# Patient Record
Sex: Female | Born: 1995 | Race: White | Hispanic: No | Marital: Single | Smoking: Current every day smoker
Health system: Southern US, Community
[De-identification: ages and names within clinical notes are randomized; demographics above are authoritative.]

## PROBLEM LIST (undated history)

## (undated) DIAGNOSIS — F329 Major depressive disorder, single episode, unspecified: Secondary | ICD-10-CM

## (undated) DIAGNOSIS — F319 Bipolar disorder, unspecified: Secondary | ICD-10-CM

## (undated) DIAGNOSIS — F419 Anxiety disorder, unspecified: Secondary | ICD-10-CM

## (undated) DIAGNOSIS — T7840XA Allergy, unspecified, initial encounter: Secondary | ICD-10-CM

## (undated) DIAGNOSIS — F32A Depression, unspecified: Secondary | ICD-10-CM

## (undated) HISTORY — PX: TYMPANOSTOMY TUBE PLACEMENT: SHX32

## (undated) HISTORY — DX: Allergy, unspecified, initial encounter: T78.40XA

## (undated) HISTORY — DX: Major depressive disorder, single episode, unspecified: F32.9

## (undated) HISTORY — DX: Anxiety disorder, unspecified: F41.9

## (undated) HISTORY — DX: Depression, unspecified: F32.A

---
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
## 2016-05-09 DIAGNOSIS — Q130 Coloboma of iris: Secondary | ICD-10-CM | POA: Insufficient documentation

## 2016-06-28 DIAGNOSIS — F4310 Post-traumatic stress disorder, unspecified: Secondary | ICD-10-CM | POA: Insufficient documentation

## 2016-06-28 DIAGNOSIS — F411 Generalized anxiety disorder: Secondary | ICD-10-CM | POA: Insufficient documentation

## 2016-08-22 DIAGNOSIS — G40209 Localization-related (focal) (partial) symptomatic epilepsy and epileptic syndromes with complex partial seizures, not intractable, without status epilepticus: Secondary | ICD-10-CM | POA: Insufficient documentation

## 2016-11-17 DIAGNOSIS — R109 Unspecified abdominal pain: Secondary | ICD-10-CM | POA: Insufficient documentation

## 2016-11-29 DIAGNOSIS — K5900 Constipation, unspecified: Secondary | ICD-10-CM | POA: Insufficient documentation

## 2017-01-21 ENCOUNTER — Ambulatory Visit (INDEPENDENT_AMBULATORY_CARE_PROVIDER_SITE_OTHER): Payer: Managed Care, Other (non HMO) | Admitting: Family Medicine

## 2017-01-21 ENCOUNTER — Encounter: Payer: Self-pay | Admitting: Family Medicine

## 2017-01-21 VITALS — BP 114/70 | HR 89 | Temp 98.1°F | Resp 17 | Ht 69.0 in | Wt 134.0 lb

## 2017-01-21 DIAGNOSIS — K5909 Other constipation: Secondary | ICD-10-CM | POA: Diagnosis not present

## 2017-01-21 DIAGNOSIS — E538 Deficiency of other specified B group vitamins: Secondary | ICD-10-CM | POA: Diagnosis not present

## 2017-01-21 DIAGNOSIS — G40909 Epilepsy, unspecified, not intractable, without status epilepticus: Secondary | ICD-10-CM

## 2017-01-21 DIAGNOSIS — N390 Urinary tract infection, site not specified: Secondary | ICD-10-CM

## 2017-01-21 DIAGNOSIS — N3000 Acute cystitis without hematuria: Secondary | ICD-10-CM

## 2017-01-21 DIAGNOSIS — R202 Paresthesia of skin: Secondary | ICD-10-CM | POA: Diagnosis not present

## 2017-01-21 DIAGNOSIS — T887XXA Unspecified adverse effect of drug or medicament, initial encounter: Secondary | ICD-10-CM | POA: Diagnosis not present

## 2017-01-21 DIAGNOSIS — M79601 Pain in right arm: Secondary | ICD-10-CM

## 2017-01-21 DIAGNOSIS — M79602 Pain in left arm: Secondary | ICD-10-CM

## 2017-01-21 DIAGNOSIS — R634 Abnormal weight loss: Secondary | ICD-10-CM

## 2017-01-21 DIAGNOSIS — F319 Bipolar disorder, unspecified: Secondary | ICD-10-CM | POA: Diagnosis not present

## 2017-01-21 LAB — POCT URINALYSIS DIP (MANUAL ENTRY)
GLUCOSE UA: NEGATIVE mg/dL
Ketones, POC UA: NEGATIVE mg/dL
NITRITE UA: POSITIVE — AB
PH UA: 6 (ref 5.0–8.0)
Protein Ur, POC: 30 mg/dL — AB
Spec Grav, UA: >=1.03 — AB (ref 1.010–1.025)
UROBILINOGEN UA: 0.2 U/dL

## 2017-01-21 LAB — POC MICROSCOPIC URINALYSIS (UMFC): MUCUS RE: ABSENT

## 2017-01-21 MED ORDER — CEPHALEXIN 500 MG PO CAPS: 500.0000 mg | ORAL_CAPSULE | Freq: Four times a day (QID) | ORAL | 0 refills | Status: DC

## 2017-01-21 NOTE — Progress Notes (Addendum)
Subjective:  Patient was seen in Room 11 .   Patient ID: Joanna Wood, female    DOB: Mar 17, 1996, 21 y.o.   MRN: [id number] Chief Complaint  Patient presents with  . Establish Care    recently moved back to town & needs to est care with a PCP  . Flu Vaccine    wants flu shot today  . Depression    per triage screening   HPI Joanna Wood is a 21 y.o. female who presents to Primary Care at [facility] to establish care. Moved from [place] last mo to be with her paternal grandmother and looking for work.  Is on generic geodon. Seeing psych at [facility] and has been restarted on lamictal and vistaril 10d ago as well. Now she is back on all her prior meds which she went off for several mos during the move except for xanax has not been restarted - feels like she would benefit from it by her anxiety.  She had stopped taking her medications intentioanlly as she had lost 30 lbs unintentionally which continued off of the meds since May (4 mos). She would get sweaty, nauseated, hand paresthesias after eating and would get cold, dizzy, paresthesias if she didn't eat.  Still with nausea.  She has sought care for this and went to the hosp where she had blood work done but was told severe UTI but nothing else wrong. She was having diffuse myalgias to the point where she couldn't get out of bed.   Was having black-out episodes - it feels like she blinks - but it will be 2 hrs later and she will be doing something else. 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  She does not have a memory of saying things.  Recurrent UTIs as she doesn't get any dysuria but just noticed decreased urine frequency and an odor.   When she wakes up her arms are sore and numb.  Chronic constipation from psych meds.    Depression screen PHQ 2/9 01/21/2017  Decreased Interest 3  Down, Depressed,  Hopeless 3  PHQ - 2 Score 6  Altered sleeping 3  Tired, decreased energy 3  Change in appetite 3  Feeling bad or failure about yourself  3  Trouble concentrating 2  Moving slowly or fidgety/restless 1  Suicidal thoughts 3  PHQ-9 Score 24  Difficult doing work/chores Very difficult      Past Medical History:  Diagnosis Date  . Allergy   . Anxiety   . Bipolar 1 disorder (HCC)   . Depression    Prior to Admission medications   Medication Sig Start Date End Date Taking? Authorizing Provider  hydrOXYzine (VISTARIL) 50 MG capsule Take 50 mg by mouth at bedtime.   Yes [provider]  lamoTRIgine (LAMICTAL) 100 MG tablet Take 150 mg by mouth every morning.   Yes [provider]  ziprasidone (GEODON) 20 MG capsule Take 20 mg by mouth 2 (two) times daily with a meal.   Yes [provider]   No Known Allergies   No past surgical history on file. Family History  Problem Relation Age of Onset  . Hypertension Mother   . Mental illness Mother   . Mental illness Sister    Social History   Social History  . Marital status: Single    Spouse name: N/A  . Number of children:  N/A  . Years of education: N/A   Social History Main Topics  . Smoking status: Current Every Day Smoker    Packs/day: 1.00    Years: 1.00    Types: Cigarettes  . Smokeless tobacco: Never Used  . Alcohol use No  . Drug use: No  . Sexual activity: Not Asked   Other Topics Concern  . None   Social History Narrative  . None     Review of Systems  Constitutional: Positive for fatigue and unexpected weight change (30 lb weight loss). Negative for activity change, appetite change, chills, diaphoresis and fever.  Gastrointestinal: Positive for constipation.  Neurological: Positive for tremors, seizures and numbness.  Psychiatric/Behavioral: Positive for confusion, decreased concentration and dysphoric mood. The patient is nervous/anxious.    See hpi    Objective:   Physical  Exam  Constitutional: She is oriented to person, place, and time. She appears well-developed and well-nourished. No distress.  HENT:  Head: Normocephalic and atraumatic.  Right Ear: External ear normal.  Left Ear: External ear normal.  Eyes: Pupils are equal, round, and reactive to light. Conjunctivae and EOM are normal. No scleral icterus.  Neck: Normal range of motion. Neck supple. No thyromegaly present.  Cardiovascular: Normal rate, regular rhythm, normal heart sounds and intact distal pulses.   Pulmonary/Chest: Effort normal and breath sounds normal. No respiratory distress.  Musculoskeletal: Normal range of motion. She exhibits no edema.  Lymphadenopathy:    She has no cervical adenopathy.  Neurological: She is alert and oriented to person, place, and time.  Skin: Skin is warm and dry. She is not diaphoretic. No erythema.  Psychiatric: She has a normal mood and affect. Her behavior is normal.  Nursing note and vitals reviewed.   BP 114/70   Pulse 89   Temp 98.1 F (36.7 C) (Oral)   Resp 17   Ht 5\' 9"  (1.753 m)   Wt 134 lb (60.8 kg)   LMP 01/16/2017 (Approximate)   SpO2 99%   BMI 19.79 kg/m     Labs reviewed from Care Everywhere: EEG 08/25/16 Interpretation and summary:    This awake and sleep EEG is normal within the broad range of variability for age.No paroxysmal epileptiform discharges or focal slowing is seen.A normal EEG does not exclude the diagnosis of a seizure disorder.Clinical correlation is advised.   On 11/13/16 and 11/17/16 you had negative blood tests for Celiac disease (gluten intolerance), H. Pylori (bacteria that causes stomach ulcers), complete metabolic panel (kidneys, liver, electrolytes), thyroid panel, negative blood pregnancy, normal blood counts. You are immune to hepatitis A but not to hepatitis B. You do not have hepatitis C.   Assessment & Plan:   1. Recent unintentional weight loss over several months   2. 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 Was on lamictal but level very subtherapeutic at the time so was rec to increase dose and f/u - instead pt when off of lamictal during move and just restarted 10d prior. Episodes still ongoing so will refer to neurology in [place] to cont further eval.  3. Bipolar 1 disorder (HCC) - following with psych at [facility]  4. Medication side effect   5. Recurrent UTI - pt does not know why she has had so many UTIs -  no prior pelvic exam so will need to RTC for repeat UA and pelvic exam - may need to consider renal US or CT urogram - consider urology eval. Start keflex.  6. Paresthesia and pain of both upper extremities - refer to neurology - check b12  7. Chronic constipation - pt thinks secondary to meds and caused chronic abdominal pain. No prior preventative treatment - start daily fiber supp  8. Vitamin B12 deficiency - check level  9. Acute cystitis without hematuria     Orders Placed This Encounter  Procedures  . Comprehensive metabolic panel  . CBC with Differential/Platelet  . Vitamin B12  . Thyroid Panel With TSH  . Sedimentation Rate  . C-reactive protein  . Hemoglobin A1c  . Ambulatory referral to Neurology    Referral Priority:   Routine    Referral Type:   Consultation    Referral Reason:   Specialty Services Required    Requested Specialty:   Neurology    Number of Visits Requested:   1  . POCT urinalysis dipstick  . POCT Microscopic Urinalysis ([facility])    Meds ordered this encounter  Medications  . ziprasidone (GEODON) 20 MG capsule    Sig: Take 20 mg by mouth 2 (two) times daily with a meal.  . cephALEXin (KEFLEX) 500 MG capsule    Sig: Take 1 capsule (500 mg total) by mouth 4 (four) times daily.    Dispense:  28 capsule     Refill:  0   Over 45 min spent in face-to-face evaluation of and consultation with patient and coordination of care.  Over 50% of this time was spent counseling this patient regarding history of seizures, chronic constipation, recurrent UTIs with review of all prior records and work-up done to date with findings, diagnosis, recommendations, then plan for moving forward in w/u.  Norberto SorensonEva Ramesses Crampton, M.D.  Primary [facility] at [facility]  [place] [address] [place], [place] [address] [phone] phone [phone] fax  01/22/17 2:43 PM  Results for orders placed or performed in visit on 01/21/17  Comprehensive metabolic panel  Result Value Ref Range   Glucose 89 65 - 99 mg/dL   BUN 15 6 - 20 mg/dL   Creatinine, Ser 1.300.81 0.57 - 1.00 mg/dL   GFR calc non Af Amer 105 >59 mL/min/1.73   GFR calc Af Amer 121 >59 mL/min/1.73   BUN/Creatinine Ratio 19 9 - 23   Sodium 141 134 - 144 mmol/L   Potassium 3.9 3.5 - 5.2 mmol/L   Chloride 103 96 - 106 mmol/L   CO2 22 20 - 29 mmol/L   Calcium 9.4 8.7 - 10.2 mg/dL   Total Protein 7.0 6.0 - 8.5 g/dL   Albumin 4.5 3.5 - 5.5 g/dL   Globulin, Total 2.5 1.5 - 4.5 g/dL   Albumin/Globulin Ratio 1.8 1.2 - 2.2   Bilirubin Total 0.4 0.0 - 1.2 mg/dL   Alkaline Phosphatase 41 39 - 117 IU/L   AST 13 0 - 40 IU/L   ALT 6 0 - 32 IU/L  CBC with Differential/Platelet  Result Value Ref Range   WBC 4.6 3.4 - 10.8 x10E3/uL   RBC 4.23 3.77 - 5.28 x10E6/uL   Hemoglobin 13.1 11.1 - 15.9 g/dL   Hematocrit 86.540.8 78.434.0 - 46.6 %   MCV 97 79 - 97 fL   MCH 31.0 26.6 - 33.0 pg   MCHC 32.1 31.5 - 35.7 g/dL   RDW 69.613.0 29.512.3 - 28.415.4 %   Platelets  244 150 - 379 x10E3/uL   Neutrophils 54 Not Estab. %   Lymphs 34 Not Estab. %   Monocytes 8 Not Estab. %   Eos 3 Not Estab. %   Basos 1 Not Estab. %   Neutrophils Absolute 2.5 1.4 - 7.0 x10E3/uL   Lymphocytes Absolute 1.6 0.7 - 3.1 x10E3/uL   Monocytes Absolute 0.4 0.1 - 0.9 x10E3/uL   EOS (ABSOLUTE) 0.1 0.0 - 0.4 x10E3/uL    Basophils Absolute 0.1 0.0 - 0.2 x10E3/uL   Immature Granulocytes 0 Not Estab. %   Immature Grans (Abs) 0.0 0.0 - 0.1 x10E3/uL  Vitamin B12  Result Value Ref Range   Vitamin B-12 WILL FOLLOW   Thyroid Panel With TSH  Result Value Ref Range   TSH 0.716 0.450 - 4.500 uIU/mL   T4, Total 8.3 4.5 - 12.0 ug/dL   T3 Uptake Ratio 28 24 - 39 %   Free Thyroxine Index 2.3 1.2 - 4.9  Sedimentation Rate  Result Value Ref Range   Sed Rate 2 0 - 32 mm/hr  C-reactive protein  Result Value Ref Range   CRP WILL FOLLOW   Hemoglobin A1c  Result Value Ref Range   Hgb A1c MFr Bld 4.9 4.8 - 5.6 %   Est. average glucose Bld gHb Est-mCnc 94 mg/dL  POCT urinalysis dipstick  Result Value Ref Range   Color, UA yellow yellow   Clarity, UA cloudy (A) clear   Glucose, UA negative negative mg/dL   Bilirubin, UA small (A) negative   Ketones, POC UA negative negative mg/dL   Spec Grav, UA >=1.610 (A) 1.010 - 1.025   Blood, UA trace-intact (A) negative   pH, UA 6.0 5.0 - 8.0   Protein Ur, POC =30 (A) negative mg/dL   Urobilinogen, UA 0.2 0.2 or 1.0 E.U./dL   Nitrite, UA Positive (A) Negative   Leukocytes, UA Small (1+) (A) Negative  POCT Microscopic Urinalysis ([facility])  Result Value Ref Range   WBC,UR,HPF,POC Few (A) None WBC/hpf   RBC,UR,HPF,POC None None RBC/hpf   Bacteria Too numerous to count  None, Too numerous to count   Mucus Absent Absent   Epithelial Cells, UR Per Microscopy Few (A) None, Too numerous to count cells/hpf

## 2017-01-21 NOTE — Patient Instructions (Addendum)
     IF you received an x-ray today, you will receive an invoice from [facility]. Please contact [facility] at [phone] with questions or concerns regarding your invoice.   IF you received labwork today, you will receive an invoice from [facility]. Please contact [facility] at [phone] with questions or concerns regarding your invoice.   Our billing staff will not be able to assist you with questions regarding bills from these companies.  You will be contacted with the lab results as soon as they are available. The fastest way to get your results is to activate your My Chart account. Instructions are located on the last page of this paperwork. If you have not heard from us regarding the results in 2 weeks, please contact this office.     Date Type Department Care Team Description  10/10/2016 Office Visit [facility]  [address], [address]  [place], [place] [id number]  [phone]  Roswell MinersMwaisela, Francis J., MD  [address]  [address]  [place], [place] [id number]  [phone]  [phone] (Fax)  Partial symptomatic epilepsy with complex partial seizures, not intractable, without status epilepticus (HCC) (Primary Dx);  Bipolar disorder, in partial remission, most recent episode depressed (HCC);  PTSD (post-traumatic stress disorder);  GAD (generalized anxiety disorder)   EEG was unremarkable  Diagnosis: ICD-10-CM  1. Partial symptomatic epilepsy with complex partial seizures, not intractable, without status epilepticus G40.209   Plan:  i asked her to discuss with the psychiatrist that her lamotrigine will need to be increased by additional 25-50 mg to a total of 200 mg lamotrigine as she is clearly subtherapeutic and her risk for breakthrough episodes remains relatively high. I have also asked her to discuss the utilization of Abilify given her somewhat sedated appearance today in the office. A follow-up visit will be scheduled.  EEG  08/25/16 Interpretation and summary:    This awake and sleep EEG is normal within the broad range of variability for age.No paroxysmal epileptiform discharges or focal slowing is seen.A normal EEG does not exclude the diagnosis of a seizure disorder.Clinical correlation is advised.   On 11/13/16 and 11/17/16 you had negative blood tests for Celiac disease (gluten intolerance), H. Pylori (bacteria that causes stomach ulcers), complete metabolic panel (kidneys, liver, electrolytes), thyroid panel, negative blood pregnancy, normal blood counts. You are immune to hepatitis A but not to hepatitis B. You do not have hepatitis C.

## 2017-01-22 LAB — CBC WITH DIFFERENTIAL/PLATELET
Basophils Absolute: 0.1 10*3/uL (ref 0.0–0.2)
Basos: 1 %
EOS (ABSOLUTE): 0.1 10*3/uL (ref 0.0–0.4)
Eos: 3 %
HEMATOCRIT: 40.8 % (ref 34.0–46.6)
Hemoglobin: 13.1 g/dL (ref 11.1–15.9)
IMMATURE GRANULOCYTES: 0 %
Immature Grans (Abs): 0 10*3/uL (ref 0.0–0.1)
LYMPHS ABS: 1.6 10*3/uL (ref 0.7–3.1)
Lymphs: 34 %
MCH: 31 pg (ref 26.6–33.0)
MCHC: 32.1 g/dL (ref 31.5–35.7)
MCV: 97 fL (ref 79–97)
MONOS ABS: 0.4 10*3/uL (ref 0.1–0.9)
Monocytes: 8 %
NEUTROS PCT: 54 %
Neutrophils Absolute: 2.5 10*3/uL (ref 1.4–7.0)
PLATELETS: 244 10*3/uL (ref 150–379)
RBC: 4.23 x10E6/uL (ref 3.77–5.28)
RDW: 13 % (ref 12.3–15.4)
WBC: 4.6 10*3/uL (ref 3.4–10.8)

## 2017-01-22 LAB — THYROID PANEL WITH TSH
Free Thyroxine Index: 2.3 (ref 1.2–4.9)
T3 Uptake Ratio: 28 % (ref 24–39)
T4 TOTAL: 8.3 ug/dL (ref 4.5–12.0)
TSH: 0.716 u[IU]/mL (ref 0.450–4.500)

## 2017-01-22 LAB — COMPREHENSIVE METABOLIC PANEL
A/G RATIO: 1.8 (ref 1.2–2.2)
ALBUMIN: 4.5 g/dL (ref 3.5–5.5)
ALK PHOS: 41 IU/L (ref 39–117)
ALT: 6 IU/L (ref 0–32)
AST: 13 IU/L (ref 0–40)
BILIRUBIN TOTAL: 0.4 mg/dL (ref 0.0–1.2)
BUN / CREAT RATIO: 19 (ref 9–23)
BUN: 15 mg/dL (ref 6–20)
CHLORIDE: 103 mmol/L (ref 96–106)
CO2: 22 mmol/L (ref 20–29)
Calcium: 9.4 mg/dL (ref 8.7–10.2)
Creatinine, Ser: 0.81 mg/dL (ref 0.57–1.00)
GFR calc Af Amer: 121 mL/min/{1.73_m2} (ref >59)
GFR calc non Af Amer: 105 mL/min/{1.73_m2} (ref >59)
GLOBULIN, TOTAL: 2.5 g/dL (ref 1.5–4.5)
Glucose: 89 mg/dL (ref 65–99)
POTASSIUM: 3.9 mmol/L (ref 3.5–5.2)
SODIUM: 141 mmol/L (ref 134–144)
Total Protein: 7 g/dL (ref 6.0–8.5)

## 2017-01-22 LAB — SEDIMENTATION RATE: SED RATE: 2 mm/h (ref 0–32)

## 2017-01-22 LAB — VITAMIN B12: Vitamin B-12: 371 pg/mL (ref 232–1245)

## 2017-01-22 LAB — HEMOGLOBIN A1C
ESTIMATED AVERAGE GLUCOSE: 94 mg/dL
Hgb A1c MFr Bld: 4.9 % (ref 4.8–5.6)

## 2017-01-22 LAB — C-REACTIVE PROTEIN

## 2017-01-24 ENCOUNTER — Encounter: Payer: Self-pay | Admitting: Neurology

## 2017-02-06 ENCOUNTER — Ambulatory Visit (INDEPENDENT_AMBULATORY_CARE_PROVIDER_SITE_OTHER): Payer: Managed Care, Other (non HMO) | Admitting: Family Medicine

## 2017-02-06 ENCOUNTER — Encounter: Payer: Self-pay | Admitting: Family Medicine

## 2017-02-06 VITALS — BP 108/68 | HR 103 | Temp 98.2°F | Resp 18 | Ht 69.0 in | Wt 136.4 lb

## 2017-02-06 DIAGNOSIS — N390 Urinary tract infection, site not specified: Secondary | ICD-10-CM

## 2017-02-06 DIAGNOSIS — M79601 Pain in right arm: Secondary | ICD-10-CM

## 2017-02-06 DIAGNOSIS — E538 Deficiency of other specified B group vitamins: Secondary | ICD-10-CM

## 2017-02-06 DIAGNOSIS — M79602 Pain in left arm: Secondary | ICD-10-CM

## 2017-02-06 DIAGNOSIS — B373 Candidiasis of vulva and vagina: Secondary | ICD-10-CM

## 2017-02-06 DIAGNOSIS — B3731 Acute candidiasis of vulva and vagina: Secondary | ICD-10-CM

## 2017-02-06 LAB — POCT URINALYSIS DIP (MANUAL ENTRY)
BILIRUBIN UA: NEGATIVE mg/dL
Bilirubin, UA: NEGATIVE
Glucose, UA: NEGATIVE mg/dL
Leukocytes, UA: NEGATIVE
Nitrite, UA: NEGATIVE
PH UA: 6 (ref 5.0–8.0)
Protein Ur, POC: NEGATIVE mg/dL
RBC UA: NEGATIVE
Spec Grav, UA: 1.025 (ref 1.010–1.025)
Urobilinogen, UA: 0.2 E.U./dL

## 2017-02-06 LAB — POCT WET + KOH PREP: TRICH BY WET PREP: ABSENT

## 2017-02-06 LAB — POC MICROSCOPIC URINALYSIS (UMFC)

## 2017-02-06 MED ORDER — CYANOCOBALAMIN 1000 MCG/ML IJ SOLN
1000.0000 ug | Freq: Once | INTRAMUSCULAR | Status: AC
  Administered 2017-02-06: 1000 ug via INTRAMUSCULAR

## 2017-02-06 MED ORDER — FLUCONAZOLE 150 MG PO TABS: 150.0000 mg | ORAL_TABLET | Freq: Once | ORAL | 0 refills | Status: AC

## 2017-02-06 MED ORDER — SUVOREXANT 10 MG PO TABS: 10.0000 mg | ORAL_TABLET | Freq: Every day | ORAL | 0 refills | Status: DC

## 2017-02-06 MED ORDER — SUVOREXANT 20 MG PO TABS: 20.0000 mg | ORAL_TABLET | Freq: Every day | ORAL | 0 refills | Status: DC

## 2017-02-06 MED ORDER — SUVOREXANT 15 MG PO TABS: 15.0000 mg | ORAL_TABLET | Freq: Every day | ORAL | 0 refills | Status: DC

## 2017-02-06 NOTE — Progress Notes (Signed)
Subjective:    Patient ID: Joanna Wood, female    DOB: Oct 20, 1995, 21 y.o.   MRN: [id number] Chief Complaint  Patient presents with  . Medication Management    Ambien   . Follow-up  . Depression    more mania than depression     HPI  UTI at visit 2 wks ago treated with keflex 500 qid x 1 wk but clx was not sent.  Sharman Crate rx'd pt ambien  #30 no refills on 01/24/2017.  Has an appointment with Dr. Karel Jarvis on 04/12/2017.    Was on prazosin but caused lightheaded and sweaty as lowered BP. Ambien causes conversations she doesn't remember.  Difficulty falling asleep and looks like things are moving. Xanax was just for anxiety - on really low dose - has not talked about being on this with Jacki Cones. Feels like xanax. Trazodone caused weird twiching/convulsing - got whiplash, had to go to the hosp for it.  Melatonin doesn't do anything.   Past Medical History:  Diagnosis Date  . Allergy   . Anxiety   . Bipolar 1 disorder (HCC)   . Depression    History reviewed. No pertinent surgical history. Current Outpatient Prescriptions on File Prior to Visit  Medication Sig Dispense Refill  . cephALEXin (KEFLEX) 500 MG capsule Take 1 capsule (500 mg total) by mouth 4 (four) times daily. 28 capsule 0  . hydrOXYzine (VISTARIL) 50 MG capsule Take 50 mg by mouth at bedtime.    . lamoTRIgine (LAMICTAL) 100 MG tablet Take 150 mg by mouth every morning.     No current facility-administered medications on file prior to visit.    No Known Allergies Family History  Problem Relation Age of Onset  . Hypertension Mother   . Mental illness Mother   . Mental illness Sister    Social History   Social History  . Marital status: Single    Spouse name: N/A  . Number of children: N/A  . Years of education: N/A   Social History Main Topics  . Smoking status: Current Every Day Smoker    Packs/day: 1.00    Years: 1.00    Types: Cigarettes  . Smokeless tobacco: Never Used  . Alcohol use  No  . Drug use: No  . Sexual activity: Not Asked   Other Topics Concern  . None   Social History Narrative  . None   Depression screen [facility] 2/9 02/06/2017 01/21/2017  Decreased Interest 3 3  Down, Depressed, Hopeless 3 3  PHQ - 2 Score 6 6  Altered sleeping 3 3  Tired, decreased energy 3 3  Change in appetite 3 3  Feeling bad or failure about yourself  3 3  Trouble concentrating 3 2  Moving slowly or fidgety/restless 0 1  Suicidal thoughts 1 3  PHQ-9 Score 22 24  Difficult doing work/chores - Very difficult     Review of Systems See hpi    Objective:   Physical Exam  Constitutional: She is oriented to person, place, and time. She appears well-developed and well-nourished. No distress.  HENT:  Head: Normocephalic and atraumatic.  Right Ear: External ear normal.  Left Ear: External ear normal.  Eyes: Conjunctivae are normal. No scleral icterus.  Neck: Normal range of motion. Neck supple. No thyromegaly present.  Cardiovascular: Normal rate, regular rhythm, normal heart sounds and intact distal pulses.   Pulmonary/Chest: Effort normal and breath sounds normal. No respiratory distress.  Genitourinary: There is tenderness on the right labia. There  is no rash or lesion on the right labia. There is tenderness on the left labia. There is no rash or lesion on the left labia. Uterus is deviated (to left). Uterus is not enlarged, not fixed and not tender. Cervix exhibits no motion tenderness and no friability. Right adnexum displays no mass and no tenderness. Left adnexum displays no mass, no tenderness and no fullness. There is erythema in the vagina. Vaginal discharge (thick white) found.  Genitourinary Comments: Pt nervous and tearful during pelvic. Has not had prior.  Musculoskeletal: She exhibits no edema.  Lymphadenopathy:    She has no cervical adenopathy.  Neurological: She is alert and oriented to person, place, and time.  Skin: Skin is warm and dry. She is not diaphoretic. No  erythema.  Psychiatric: She has a normal mood and affect. Her behavior is normal.      BP 108/68   Pulse (!) 103   Temp 98.2 F (36.8 C) (Oral)   Resp 18   Ht  (1.753 m)   Wt 136 lb 6.4 oz (61.9 kg)   LMP 01/16/2017 (Approximate)   SpO2 96%   BMI 20.14 kg/m   Filed Weights   02/06/17 1531  Weight: 136 lb 6.4 oz (61.9 kg)  Up from 134 at last visit     Results for orders placed or performed in visit on 02/06/17  POCT Wet + KOH Prep  Result Value Ref Range   Yeast by KOH Present (A) Absent   Yeast by wet prep Present (A) Absent   WBC by wet prep None (A) Few   Clue Cells Wet Prep HPF POC None None   Trich by wet prep Absent Absent   Bacteria Wet Prep HPF POC Many (A) Few   Epithelial Cells By [employer] Pref (UMFC) Moderate (A) None, Few, Too numerous to count   RBC,UR,HPF,POC None None RBC/hpf    Assessment & Plan:   1. Recurrent UTI   2. Vaginal candidiasis   3. Pain in both upper extremities   4. Low vitamin B12 level     Orders Placed This Encounter  Procedures  . Urine Culture  . GC/Chlamydia Probe Amp  . POCT urinalysis dipstick  . POCT Microscopic Urinalysis (UMFC)  . POCT Wet + KOH Prep    Meds ordered this encounter  Medications  . DISCONTD: zolpidem (AMBIEN) 5 MG tablet    Sig: Take 5 mg by mouth at bedtime as needed for sleep.  . fluconazole (DIFLUCAN) 150 MG tablet    Sig: Take 1 tablet (150 mg total) by mouth once. Repeat dose after 3 days    Dispense:  2 tablet    Refill:  0  . ziprasidone (GEODON) 40 MG capsule    Sig: Take 40 mg by mouth at bedtime.  Marland Kitchen DISCONTD: Suvorexant (BELSOMRA) 20 MG TABS    Sig: Take 20 mg by mouth at bedtime.    Dispense:  30 tablet    Refill:  0  . cyanocobalamin ((VITAMIN B-12)) injection 1,000 mcg  . DISCONTD: Suvorexant (BELSOMRA) 20 MG TABS    Sig: Take 20 mg by mouth at bedtime.    Dispense:  10 tablet    Refill:  0  . DISCONTD: Suvorexant (BELSOMRA) 15 MG TABS    Sig: Take 15 mg by mouth at bedtime.      Dispense:  10 tablet    Refill:  0  . Suvorexant (BELSOMRA) 10 MG TABS    Sig: Take 10 mg by  mouth at bedtime.    Dispense:  10 tablet    Refill:  0    [name], M.D.  Primary Care at [facility] [address] [place], [place] [address] [phone] phone [phone] fax  02/09/17 5:53 AM

## 2017-02-06 NOTE — Patient Instructions (Addendum)
Start on a daily vitamin b12 supplement.    IF you received an x-ray today, you will receive an invoice from [facility]. Please contact [facility] at [phone] with questions or concerns regarding your invoice.   IF you received labwork today, you will receive an invoice from [facility]. Please contact [facility] at [phone] with questions or concerns regarding your invoice.   Our billing staff will not be able to assist you with questions regarding bills from these companies.  You will be contacted with the lab results as soon as they are available. The fastest way to get your results is to activate your My Chart account. Instructions are located on the last page of this paperwork. If you have not heard from [place] regarding the results in 2 weeks, please contact this office.      Vaginal Yeast infection, Adult Vaginal yeast infection is a condition that causes soreness, swelling, and redness (inflammation) of the vagina. It also causes vaginal discharge. This is a common condition. Some women get this infection frequently. What are the causes? This condition is caused by a change in the normal balance of the yeast (candida) and bacteria that live in the vagina. This change causes an overgrowth of yeast, which causes the inflammation. What increases the risk? This condition is more likely to develop in:  Women who take antibiotic medicines.  Women who have diabetes.  Women who take birth control pills.  Women who are pregnant.  Women who douche often.  Women who have a weak defense (immune) system.  Women who have been taking steroid medicines for a long time.  Women who frequently wear tight clothing.  What are the signs or symptoms? Symptoms of this condition include:  White, thick vaginal discharge.  Swelling, itching, redness, and irritation of the vagina. The lips of the vagina (vulva) may be affected as well.  Pain or a burning feeling while  urinating.  Pain during sex.  How is this diagnosed? This condition is diagnosed with a medical history and physical exam. This will include a pelvic exam. Your health care provider will examine a sample of your vaginal discharge under a microscope. Your health care provider may send this sample for testing to confirm the diagnosis. How is this treated? This condition is treated with medicine. Medicines may be over-the-counter or prescription. You may be told to use one or more of the following:  Medicine that is taken orally.  Medicine that is applied as a cream.  Medicine that is inserted directly into the vagina (suppository).  Follow these instructions at home:  Take or apply over-the-counter and prescription medicines only as told by your health care provider.  Do not have sex until your health care provider has approved. Tell your sex partner that you have a yeast infection. That person should go to his or her health care provider if he or she develops symptoms.  Do not wear tight clothes, such as pantyhose or tight pants.  Avoid using tampons until your health care provider approves.  Eat more yogurt. This may help to keep your yeast infection from returning.  Try taking a sitz bath to help with discomfort. This is a warm water bath that is taken while you are sitting down. The water should only come up to your hips and should cover your buttocks. Do this 3-4 times per day or as told by your health care provider.  Do not douche.  Wear breathable, cotton underwear.  If you have diabetes,  keep your blood sugar levels under control. Contact a health care provider if:  You have a fever.  Your symptoms go away and then return.  Your symptoms do not get better with treatment.  Your symptoms get worse.  You have new symptoms.  You develop blisters in or around your vagina.  You have blood coming from your vagina and it is not your menstrual period.  You develop pain  in your abdomen. This information is not intended to replace advice given to you by your health care provider. Make sure you discuss any questions you have with your health care provider. Document Released: 02/16/2005 Document Revised: 10/21/2015 Document Reviewed: 11/10/2014 Elsevier Interactive Patient Education  2018 [employer].

## 2017-02-07 ENCOUNTER — Telehealth: Payer: Self-pay | Admitting: *Deleted

## 2017-02-07 ENCOUNTER — Telehealth: Payer: Self-pay | Admitting: Family Medicine

## 2017-02-07 LAB — URINE CULTURE

## 2017-02-07 NOTE — Telephone Encounter (Signed)
Pt grandmother states that coupon for the medication to help her sleep was not valid due to missing bar code and would like to have a new coupon faxed to her Pharmacy [facility] on [address]. Please advise

## 2017-02-07 NOTE — Telephone Encounter (Signed)
Anyone can just print the coupon from online.  Just search on google for "belviq coupon" and go to the pharmaceutical companies website and there will be a link you can click to download a new one - can probably just leave it on the phone and have the pharmacist scan it from there w/o having to print it out.

## 2017-02-07 NOTE — Telephone Encounter (Signed)
Pt is requesting coupon for Belsomra, coupon was faxed over to [place], Luna Kitchens [phone]

## 2017-02-07 NOTE — Telephone Encounter (Signed)
Pt is needing to talk with someone regarding a coupon for the medication to help her sleep  Best number [phone]

## 2017-02-08 LAB — GC/CHLAMYDIA PROBE AMP
Chlamydia trachomatis, NAA: NEGATIVE
Neisseria gonorrhoeae by PCR: NEGATIVE

## 2017-04-12 ENCOUNTER — Ambulatory Visit: Payer: Managed Care, Other (non HMO) | Admitting: Neurology

## 2017-06-15 ENCOUNTER — Encounter: Payer: Self-pay | Admitting: Neurology

## 2017-06-15 ENCOUNTER — Ambulatory Visit: Payer: Managed Care, Other (non HMO) | Admitting: Neurology

## 2017-06-15 VITALS — BP 98/62 | HR 77 | Ht 69.0 in | Wt 146.0 lb

## 2017-06-15 DIAGNOSIS — G40009 Localization-related (focal) (partial) idiopathic epilepsy and epileptic syndromes with seizures of localized onset, not intractable, without status epilepticus: Secondary | ICD-10-CM

## 2017-06-15 MED ORDER — LAMOTRIGINE 200 MG PO TABS: 200.0000 mg | ORAL_TABLET | Freq: Every day | ORAL | 11 refills | Status: DC

## 2017-06-15 NOTE — Progress Notes (Signed)
NEUROLOGY CONSULTATION NOTE  Jolaine Fryberger MRN: [id number] DOB: 25-Nov-1995  Referring provider: Dr. [name] Primary care provider: Dr. [name]  Reason for consult:  seizures  Dear Dr Clelia Croft:  Thank you for your kind referral of Etta Grandchild for consultation of the above symptoms. Although her history is well known to you, please allow me to reiterate it for the purpose of our medical record. Records and images were personally reviewed where available.  HISTORY OF PRESENT ILLNESS: This is a pleasant 22 year old right-handed woman with a history of bipolar disorder, seizures, presenting to establish care. She had moved from [place] to [place] to live with her grandmother. Records from [place] were reviewed. She was admitted to [facility] for gabapentin overdose in February 2018. During that admission, she reported a history of blackout episodes and was evaluated by neurologist Dr. Murtis Sink. She was started by her psychiatrist on Lamotrigine, which Dr. Murtis Sink recommended increasing the dose of. She was last seen by Dr. Murtis Sink in May 2018. She had an awake and asleep EEG which was within broad limits of normal. There is a head CT without contrast which was unremarkable. She has been taking Lamotrigine 200mg  daily, and denies any further blackouts since October 2018. She reports these episodes started in her junior year of high school. She would feel like she just blinked, then find that she had completed a task or is somewhere else, but did not recall how it happened. She was taking an order one time, then the next thing she knew, the whole order was written down, but she did not recall. She was doing the dishes, blinked, then was in front of the cash register but did not recall how. She would feel like she had been woken up suddenly and briefly disoriented, sometimes her hands feels jerky after. She denies any olfactory/gustatory hallucinations, deja vu, rising epigastric sensation,  focal numbness/tingling/weakness, myoclonic jerks. When she moved, she was out of her medications and felt that her "brain was clenching and eyes were doing weird stuff." This has stopped now that she is back on Lamotrigine. She states she has a terrible sleep schedule, she was only getting 3 hours of sleep. She now gets 4-5 hours of sleep. She has occasional headaches, no dizziness, diplopia, dysarthria/dysphagia, bowel/bladder dysfunction. She has neck and back pain. She is trying to find a new psychiatrist and therapist. She feels her memory is pretty bad. She works at [employer]. Mood is okay, no suicidal ideation. She does not drive.   Epilepsy Risk Factors:  She had a normal birth and early development.  There is no history of febrile convulsions, CNS infections such as meningitis/encephalitis, significant traumatic brain injury, neurosurgical procedures, or family history of seizures.  PAST MEDICAL HISTORY: Past Medical History:  Diagnosis Date  . Allergy   . Anxiety   . Bipolar 1 disorder (HCC)   . Depression     PAST SURGICAL HISTORY: No past surgical history on file.  MEDICATIONS:  Outpatient Encounter Medications as of 06/15/2017  Medication Sig  . busPIRone (BUSPAR) 15 MG tablet   . hydrOXYzine (ATARAX/VISTARIL) 50 MG tablet   . hydrOXYzine (VISTARIL) 50 MG capsule Take 50 mg by mouth at bedtime.  . lamoTRIgine (LAMICTAL) 200 MG tablet Take 1 tablet (200 mg total) by mouth daily.  . ziprasidone (GEODON) 60 MG capsule   .    .    . Suvorexant (BELSOMRA) 10 MG TABS Take 10 mg by mouth at bedtime. (Patient not  taking: Reported on 06/15/2017)  . [DISCONTINUED] cephALEXin (KEFLEX) 500 MG capsule Take 1 capsule (500 mg total) by mouth 4 (four) times daily.  . [DISCONTINUED] ziprasidone (GEODON) 40 MG capsule Take 40 mg by mouth at bedtime.   No facility-administered encounter medications on file as of 06/15/2017.     ALLERGIES: No Known Allergies  FAMILY HISTORY: Family History   Problem Relation Age of Onset  . Hypertension Mother   . Mental illness Mother   . Mental illness Sister     SOCIAL HISTORY: Social History   Socioeconomic History  . Marital status: Single    Spouse name: Not on file  . Number of children: Not on file  . Years of education: Not on file  . Highest education level: Not on file  Social Needs  . Financial resource strain: Not on file  . Food insecurity - worry: Not on file  . Food insecurity - inability: Not on file  . Transportation needs - medical: Not on file  . Transportation needs - non-medical: Not on file  Occupational History  . Not on file  Tobacco Use  . Smoking status: Current Every Day Smoker    Packs/day: 1.00    Years: 1.00    Pack years: 1.00    Types: Cigarettes  . Smokeless tobacco: Never Used  Substance and Sexual Activity  . Alcohol use: No  . Drug use: No  . Sexual activity: Not on file  Other Topics Concern  . Not on file  Social History Narrative  . Not on file    REVIEW OF SYSTEMS: Constitutional: No fevers, chills, or sweats, no generalized fatigue, change in appetite Eyes: No visual changes, double vision, eye pain Ear, nose and throat: No hearing loss, ear pain, nasal congestion, sore throat Cardiovascular: No chest pain, palpitations Respiratory:  No shortness of breath at rest or with exertion, wheezes GastrointestinaI: No nausea, vomiting, diarrhea, abdominal pain, fecal incontinence Genitourinary:  No dysuria, urinary retention or frequency Musculoskeletal:  + neck pain, back pain Integumentary: No rash, pruritus, skin lesions Neurological: as above Psychiatric: No depression, insomnia, anxiety Endocrine: No palpitations, fatigue, diaphoresis, mood swings, change in appetite, change in weight, increased thirst Hematologic/Lymphatic:  No anemia, purpura, petechiae. Allergic/Immunologic: no itchy/runny eyes, nasal congestion, recent allergic reactions, rashes  PHYSICAL EXAM: Vitals:     06/15/17 1043  BP: 98/62  Pulse: 77  SpO2: 98%   General: No acute distress Head:  Normocephalic/atraumatic Eyes: Fundoscopic exam shows bilateral sharp discs, no vessel changes, exudates, or hemorrhages Neck: supple, no paraspinal tenderness, full range of motion Back: No paraspinal tenderness Heart: regular rate and rhythm Lungs: Clear to auscultation bilaterally. Vascular: No carotid bruits. Skin/Extremities: No rash, no edema Neurological Exam: Mental status: alert and oriented to person, place, and time, no dysarthria or aphasia, Fund of knowledge is appropriate.  Recent and remote memory are intact. 3/3 delayed recall.  Attention and concentration are normal.    Able to name objects and repeat phrases. Cranial nerves: CN I: not tested CN II: irregular pupils, right coloboma (chronic since childhood), left pupil round and reactive to light, visual fields intact, fundi unremarkable. CN III, IV, VI:  full range of motion, no nystagmus, no ptosis CN V: reports decreased pin on right V1, left V2 CN VII: upper and lower face symmetric CN VIII: hearing intact to finger rub CN IX, X: gag intact, uvula midline CN XI: sternocleidomastoid and trapezius muscles intact CN XII: tongue midline Bulk & Tone: normal, no  fasciculations. Motor: 5/5 throughout with no pronator drift. Sensation: intact to light touch, cold, pin, vibration and joint position sense.  No extinction to double simultaneous stimulation.  Romberg test none Deep Tendon Reflexes: +2 throughout, no ankle clonus Plantar responses: downgoing bilaterally Cerebellar: no incoordination on finger to nose testing Gait: narrow-based and steady, able to tandem walk adequately. Tremor: none  IMPRESSION: This is a pleasant 22 year old right-handed woman with a history of bipolar disease and episodes of blackouts where she loses time and can still complete tasks but is unaware of this. Etiology of symptoms is unclear, she has been  diagnosed with focal seizures with impaired awareness, which is a possibility, however similar symptoms could occur with her underlying psychiatric condition. Her routine EEG was normal. MRI brain with and without contrast will be ordered to assess for underlying structural abnormality. She has not had any further symptoms since October on Lamotrigine to 200mg  daily. Refills sent. She was advised to continue follow-up with psychiatry and psychotherapy. We discussed that if symptoms recur on current medication, she will be scheduled for a 48-hour EEG to classify these episodes.  [place] driving laws were discussed with the patient, and she knows to stop driving after an episode of loss of awareness, until 6 months event-free. She will follow-up in 6 months and knows to call for any changes.  Thank you for allowing me to participate in the care of this patient. Please do not hesitate to call for any questions or concerns.   Patrcia Dolly, M.D.  CC: Dr. Clelia Croft

## 2017-06-15 NOTE — Patient Instructions (Addendum)
1. Schedule MRI brain with and without contrast  We [facility] and they will call you directly to schedule your appt. They are located at [address] If you need to contact them directly please call [phone].   2. Continue Lamotrigine 200mg  daily 3. Continue follow-up with psychiatrist and therapist 4. Follow-up in 6 months, call for any changes  Seizure Precautions: 1. If medication has been prescribed for you to prevent seizures, take it exactly as directed.  Do not stop taking the medicine without talking to your doctor first, even if you have not had a seizure in a long time.   2. Avoid activities in which a seizure would cause danger to yourself or to others.  Don't operate dangerous machinery, swim alone, or climb in high or dangerous places, such as on ladders, roofs, or girders.  Do not drive unless your doctor says you may.  3. If you have any warning that you may have a seizure, lay down in a safe place where you can't hurt yourself.    4.  No driving for 6 months from last seizure, as [place].   Please refer to the following link on the Epilepsy Foundation of America's website for more information: http://www.epilepsyfoundation.org/answerplace/Social/driving/drivingu.cfm   5.  Maintain good sleep hygiene. Avoid alcohol.  6.  Notify your neurology if you are planning pregnancy or if you become pregnant.  7.  Contact your doctor if you have any problems that may be related to the medicine you are taking.  8.  Call 911 and bring the patient back to the ED if:        A.  The seizure lasts longer than 5 minutes.       B.  The patient doesn't awaken shortly after the seizure  C.  The patient has new problems such as difficulty seeing, speaking or moving  D.  The patient was injured during the seizure  E.  The patient has a temperature over 102 F (39C)  F.  The patient vomited and now is having trouble  breathing

## 2017-07-15 ENCOUNTER — Ambulatory Visit
Admission: RE | Admit: 2017-07-15 | Discharge: 2017-07-15 | Disposition: A | Discharge status: Home / Self Care | Payer: Managed Care, Other (non HMO) | Source: Ambulatory Visit | Attending: Neurology | Admitting: Neurology

## 2017-07-15 DIAGNOSIS — G40009 Localization-related (focal) (partial) idiopathic epilepsy and epileptic syndromes with seizures of localized onset, not intractable, without status epilepticus: Secondary | ICD-10-CM

## 2017-07-15 MED ORDER — GADOBENATE DIMEGLUMINE 529 MG/ML IV SOLN
14.0000 mL | Freq: Once | INTRAVENOUS | Status: AC | PRN
  Administered 2017-07-15: 14 mL via INTRAVENOUS

## 2017-07-17 ENCOUNTER — Telehealth: Payer: Self-pay

## 2017-07-17 NOTE — Telephone Encounter (Signed)
-----   Message from Van ClinesKaren M Aquino, MD sent at 07/16/2017 11:13 AM EST ----- Pls let her know I reviewed MRI brain, it is normal, no evidence of tumor, stroke, or bleed. Thanks

## 2017-07-17 NOTE — Telephone Encounter (Signed)
Spoke with pt relaying message below.   

## 2017-08-03 ENCOUNTER — Ambulatory Visit (INDEPENDENT_AMBULATORY_CARE_PROVIDER_SITE_OTHER): Payer: Managed Care, Other (non HMO)

## 2017-08-03 ENCOUNTER — Ambulatory Visit: Payer: Managed Care, Other (non HMO) | Admitting: Family Medicine

## 2017-08-03 ENCOUNTER — Encounter: Payer: Self-pay | Admitting: Family Medicine

## 2017-08-03 VITALS — BP 111/70 | HR 78 | Temp 98.7°F | Ht 69.5 in | Wt 154.0 lb

## 2017-08-03 DIAGNOSIS — E538 Deficiency of other specified B group vitamins: Secondary | ICD-10-CM

## 2017-08-03 DIAGNOSIS — N390 Urinary tract infection, site not specified: Secondary | ICD-10-CM

## 2017-08-03 DIAGNOSIS — G40009 Localization-related (focal) (partial) idiopathic epilepsy and epileptic syndromes with seizures of localized onset, not intractable, without status epilepticus: Secondary | ICD-10-CM | POA: Diagnosis not present

## 2017-08-03 DIAGNOSIS — R351 Nocturia: Secondary | ICD-10-CM | POA: Diagnosis not present

## 2017-08-03 DIAGNOSIS — R110 Nausea: Secondary | ICD-10-CM

## 2017-08-03 DIAGNOSIS — R5382 Chronic fatigue, unspecified: Secondary | ICD-10-CM

## 2017-08-03 DIAGNOSIS — R1084 Generalized abdominal pain: Secondary | ICD-10-CM | POA: Diagnosis not present

## 2017-08-03 DIAGNOSIS — R631 Polydipsia: Secondary | ICD-10-CM | POA: Diagnosis not present

## 2017-08-03 DIAGNOSIS — K5904 Chronic idiopathic constipation: Secondary | ICD-10-CM

## 2017-08-03 DIAGNOSIS — E559 Vitamin D deficiency, unspecified: Secondary | ICD-10-CM

## 2017-08-03 DIAGNOSIS — F17200 Nicotine dependence, unspecified, uncomplicated: Secondary | ICD-10-CM | POA: Diagnosis not present

## 2017-08-03 DIAGNOSIS — R319 Hematuria, unspecified: Secondary | ICD-10-CM | POA: Diagnosis not present

## 2017-08-03 DIAGNOSIS — Z23 Encounter for immunization: Secondary | ICD-10-CM | POA: Diagnosis not present

## 2017-08-03 LAB — POCT URINALYSIS DIP (MANUAL ENTRY)
BILIRUBIN UA: NEGATIVE mg/dL
Glucose, UA: NEGATIVE mg/dL
Nitrite, UA: POSITIVE — AB
Spec Grav, UA: >=1.03 — AB (ref 1.010–1.025)
Urobilinogen, UA: 1 E.U./dL
pH, UA: 6 (ref 5.0–8.0)

## 2017-08-03 LAB — POCT CBC
GRANULOCYTE PERCENT: 70.9 % (ref 37–80)
HEMATOCRIT: 39.4 % (ref 37.7–47.9)
Hemoglobin: 13 g/dL (ref 12.2–16.2)
Lymph, poc: 1.8 (ref 0.6–3.4)
MCH: 30.7 pg (ref 27–31.2)
MCHC: 32.9 g/dL (ref 31.8–35.4)
MCV: 93.4 fL (ref 80–97)
MID (CBC): 0.2 (ref 0–0.9)
MPV: 7.6 fL (ref 0–99.8)
PLATELET COUNT, POC: 255 10*3/uL (ref 142–424)
POC GRANULOCYTE: 4.8 (ref 2–6.9)
POC LYMPH PERCENT: 26.2 %L (ref 10–50)
POC MID %: 2.9 %M (ref 0–12)
RBC: 4.22 M/uL (ref 4.04–5.48)
RDW, POC: 13.2 %
WBC: 6.8 10*3/uL (ref 4.6–10.2)

## 2017-08-03 MED ORDER — CYANOCOBALAMIN 1000 MCG/ML IJ SOLN
1000.0000 ug | Freq: Once | INTRAMUSCULAR | Status: AC
Start: 2017-08-03 — End: 2017-08-03
  Administered 2017-08-03: 1000 ug via INTRAMUSCULAR

## 2017-08-03 MED ORDER — LAMOTRIGINE 200 MG PO TABS: 200.0000 mg | ORAL_TABLET | Freq: Every day | ORAL | 0 refills | Status: AC

## 2017-08-03 MED ORDER — POLYETHYLENE GLYCOL 3350 17 GM/SCOOP PO POWD: 17.0000 g | Freq: Two times a day (BID) | ORAL | 1 refills | Status: AC | PRN

## 2017-08-03 MED ORDER — OMEPRAZOLE 40 MG PO CPDR: 40.0000 mg | DELAYED_RELEASE_CAPSULE | Freq: Every day | ORAL | 3 refills | Status: AC

## 2017-08-03 MED ORDER — ZIPRASIDONE HCL 60 MG PO CAPS: 60.0000 mg | ORAL_CAPSULE | Freq: Every day | ORAL | 0 refills | Status: AC

## 2017-08-03 NOTE — Progress Notes (Signed)
Subjective:  By signing my name below, I, Joanna Wood, attest that this documentation has been prepared under the direction and in the presence of Joanna Sorenson, MD Electronically Signed: Charline Bills, ED Scribe 08/03/2017 at 11:57 AM.   Patient ID: Joanna Wood, female    DOB: [birth year], 22 y.o.   MRN: [id number]  Chief Complaint  Patient presents with  . Medication Refill    lamictal  . eating issues    feels nauseous after eating, abd pain, tired  . Constipation    states bright red bleeding with almost every bowel movement  . Depression    per triage - pt states she has a plan but doesn't mean she will do it.  . Medication Refill    all meds   HPI Joanna Wood is a 22 y.o. female who presents to Primary Care at [facility]. Last visit 6 moths ago, tried on Belsomra for insomnia. She was already on Geodon 40 mg qhs, zolpidem 5 mg. Failed prazosin due to orthostatic symptoms. Has used low dose alprazolam for anxiety but did not cause sedation. Trazodone caused twitching. Melatonin ineffective. Seeing psychiatry at [facility]. On Lamictal from them and Dr. Karel Wood for possibility of focal seizures with impaired awareness. Abdominal pain, nausea, vomiting, anorexia, constipation are all potential side effects of Lamictal.  Pt reports GI symptoms since April 2018 which have worsened. States she lost 30 lbs in 2 months but is finally beginning to gain the weight back. She reports some irregular appetite, bloating, abdominal pain, nausea, fatigue and diaphoresis after eating. She has also noticed heartburn/indigestion, urinary frequency and polydipsia; states she wakes up ~4 times/night to drink water. Also reports hard, sharp stools that "cut" her when she's not on her menstrual periods, softer BMs when on her period. She was on stool softeners after she got a CT but has gone off of them since. Denies melena. States she is not still seeing psychiatrist at [facility]  due to distance. She went to a new physician on Monday who ended up being a therapist. She has been spacing Lamictal so she wouldn't go a full week without it until she finds another psychiatrist. Pt has not noticed improvement in GI symptoms since spacing Lamictal. Denies possibility of pregnancy.  She is taking 15 mg Buspar once nightly which she states makes her "jump in her skin". Pt was taking 60 mg Geodon at night but ran out ~1 wk ago. Also using 50 mg hydroxyzine at night.  Depression screen [facility] 2/9 08/03/2017 02/06/2017 01/21/2017  Decreased Interest 3 3 3   Down, Depressed, Hopeless 3 3 3   PHQ - 2 Score 6 6 6   Altered sleeping 3 3 3   Tired, decreased energy 3 3 3   Change in appetite 3 3 3   Feeling bad or failure about yourself  3 3 3   Trouble concentrating 3 3 2   Moving slowly or fidgety/restless 0 0 1  Suicidal thoughts 3 1 3   PHQ-9 Score 24 22 24   Difficult doing work/chores - - Very difficult   Past Medical History:  Diagnosis Date  . Allergy   . Anxiety   . Bipolar 1 disorder (HCC)   . Depression    Current Outpatient Medications on File Prior to Visit  Medication Sig Dispense Refill  . busPIRone (BUSPAR) 15 MG tablet     . hydrOXYzine (VISTARIL) 50 MG capsule Take 50 mg by mouth at bedtime.    . lamoTRIgine (LAMICTAL) 200 MG tablet Take  1 tablet (200 mg total) by mouth daily. 30 tablet 11  . ziprasidone (GEODON) 60 MG capsule     . Suvorexant (BELSOMRA) 10 MG TABS Take 10 mg by mouth at bedtime. (Patient not taking: Reported on 06/15/2017) 10 tablet 0   No current facility-administered medications on file prior to visit.    No Known Allergies   Past Surgical History:  Procedure Laterality Date  . TYMPANOSTOMY TUBE PLACEMENT     Family History  Problem Relation Age of Onset  . Hypertension Mother   . Mental illness Mother   . Mental illness Sister    Social History   Socioeconomic History  . Marital status: Single    Spouse name: None  . Number of children:  None  . Years of education: None  . Highest education level: None  Social Needs  . Financial resource strain: None  . Food insecurity - worry: None  . Food insecurity - inability: None  . Transportation needs - medical: None  . Transportation needs - non-medical: None  Occupational History  . None  Tobacco Use  . Smoking status: Current Every Day Smoker    Packs/day: 1.00    Years: 1.00    Pack years: 1.00    Types: Cigarettes  . Smokeless tobacco: Never Used  Substance and Sexual Activity  . Alcohol use: No  . Drug use: No  . Sexual activity: None  Other Topics Concern  . None  Social History Narrative   Pt lives in 1 story home with her grandmother   Has 1 year of college   Works as Mudlogger with [employer]     Review of Systems  Constitutional: Positive for appetite change, diaphoresis and fatigue.  Gastrointestinal: Positive for abdominal distention, abdominal pain, constipation and nausea.  Endocrine: Positive for polydipsia.  Genitourinary: Positive for frequency.  Psychiatric/Behavioral: Positive for dysphoric mood.      Objective:   Physical Exam  Constitutional: She is oriented to person, place, and time. She appears well-developed and well-nourished. No distress.  HENT:  Head: Normocephalic and atraumatic.  Eyes: Conjunctivae and EOM are normal.  Neck: Neck supple. No tracheal deviation present.  Cardiovascular: Normal rate, regular rhythm, S1 normal, S2 normal and normal heart sounds.  Pulmonary/Chest: Effort normal and breath sounds normal. No respiratory distress.  Abdominal: Soft. Bowel sounds are normal. She exhibits no mass. There is no hepatosplenomegaly. There is tenderness (throughout). There is negative Murphy's sign.  Musculoskeletal: Normal range of motion.  Neurological: She is alert and oriented to person, place, and time.  Skin: Skin is warm and dry.  Psychiatric: Her behavior is normal.  Nursing note and vitals reviewed.  BP 111/70 (BP  Location: Right Arm, Patient Position: Sitting, Cuff Size: Normal)   Pulse 78   Temp 98.7 F (37.1 C) (Oral)   Ht 5' 9.5" (1.765 m)   Wt 154 lb (69.9 kg)   LMP 08/02/2017   SpO2 100%   BMI 22.42 kg/m     Dg Abd 1 View  Result Date: 08/03/2017 CLINICAL DATA:  Nausea after eating, some bright red blood per rectum EXAM: ABDOMEN - 1 VIEW COMPARISON:  None. FINDINGS: Supine views of the abdomen do show a moderately large amount of feces throughout the colon particularly the right colon and transverse colon. No bowel obstruction is seen. No opaque calculi are noted. The bones are unremarkable. IMPRESSION: Moderately large amount of feces primarily in the right colon and transverse colon. No bowel obstruction. Electronically Signed  By: Dwyane Dee M.D.   On: 08/03/2017 12:29   Mr Laqueta Jean ZO Contrast  Result Date: 07/15/2017 CLINICAL DATA:  Seizure like activity since 2015. EXAM: MRI HEAD WITHOUT AND WITH CONTRAST TECHNIQUE: Multiplanar, multiecho pulse sequences of the brain and surrounding structures were obtained without and with intravenous contrast. CONTRAST:  14mL MULTIHANCE GADOBENATE DIMEGLUMINE 529 MG/ML IV SOLN COMPARISON:  None. FINDINGS: Brain: Brain has normal appearance without evidence of malformation, atrophy, old or acute small or large vessel infarction, mass lesion, hemorrhage, hydrocephalus or extra-axial collection. No mesial temporal abnormality is seen. No abnormal contrast enhancement. Vascular: Major vessels at the base of the brain show flow. Venous sinuses appear patent. Skull and upper cervical spine: Normal. Sinuses/Orbits: Mild seasonal mucosal thickening. No advanced sinusitis. Orbits negative. Other: None significant. IMPRESSION: Normal examination.  No abnormality seen to explain seizures. Electronically Signed   By: Paulina Fusi M.D.   On: 07/15/2017 09:36    Assessment & Plan:   1. Nausea without vomiting   2. Generalized abdominal pain   3. Chronic fatigue   4.  Localization-related idiopathic epilepsy and epileptic syndromes with seizures of localized onset, not intractable, without status epilepticus (HCC)   5. Polydipsia   6. Nocturia   7. Vitamin B12 deficiency - felt a lot better after b12 inj at last visit so requests to repeat  8. Chronic idiopathic constipation   9. Vitamin D deficiency   10. Urinary tract infection with hematuria, site unspecified     Orders Placed This Encounter  Procedures  . DG Abd 1 View    Standing Status:   Future    Number of Occurrences:   1    Standing Expiration Date:   08/03/2018    Order Specific Question:   Reason for Exam (SYMPTOM  OR DIAGNOSIS REQUIRED)    Answer:   nause after eating and BRBPR, suspect constipation    Order Specific Question:   Is the patient pregnant?    Answer:   No    Order Specific Question:   Preferred imaging location?    Answer:   External  . Tdap vaccine greater than or equal to 7yo IM  . Comprehensive metabolic panel  . H. pylori breath test  . Lipase  . Vitamin B12  . VITAMIN D 25 Hydroxy (Vit-D Deficiency, Fractures)  . Hemoglobin A1c  . Celiac Disease Ab Screen w/Rfx  . POCT CBC  . POCT urinalysis dipstick    Meds ordered this encounter  Medications  . lamoTRIgine (LAMICTAL) 200 MG tablet    Sig: Take 1 tablet (200 mg total) by mouth daily.    Dispense:  90 tablet    Refill:  0  . ziprasidone (GEODON) 60 MG capsule    Sig: Take 1 capsule (60 mg total) by mouth at bedtime.    Dispense:  90 capsule    Refill:  0  . omeprazole (PRILOSEC) 40 MG capsule    Sig: Take 1 capsule (40 mg total) by mouth at bedtime.    Dispense:  30 capsule    Refill:  3  . cyanocobalamin ((VITAMIN B-12)) injection 1,000 mcg  . polyethylene glycol powder (GLYCOLAX/MIRALAX) powder    Sig: Take 17 g by mouth 2 (two) times daily as needed.    Dispense:  500 g    Refill:  1    I personally performed the services described in this documentation, which was scribed in my presence. The  recorded information has been reviewed and  considered, and addended by me as needed.   Joanna SorensonEva Shaw, M.D.  Primary Care at [facility]  [place] [address] [place], [place] [address] [phone] phone [phone] fax  08/06/17 8:47 AM  Results for orders placed or performed in visit on 08/03/17  Comprehensive metabolic panel  Result Value Ref Range   Glucose 85 65 - 99 mg/dL   BUN 8 6 - 20 mg/dL   Creatinine, Ser 2.590.63 0.57 - 1.00 mg/dL   GFR calc non Af Amer 129 >59 mL/min/1.73   GFR calc Af Amer 148 >59 mL/min/1.73   BUN/Creatinine Ratio 13 9 - 23   Sodium 140 134 - 144 mmol/L   Potassium 4.6 3.5 - 5.2 mmol/L   Chloride 107 (H) 96 - 106 mmol/L   CO2 22 20 - 29 mmol/L   Calcium 8.6 (L) 8.7 - 10.2 mg/dL   Total Protein 6.5 6.0 - 8.5 g/dL   Albumin 4.1 3.5 - 5.5 g/dL   Globulin, Total 2.4 1.5 - 4.5 g/dL   Albumin/Globulin Ratio 1.7 1.2 - 2.2   Bilirubin Total <0.2 0.0 - 1.2 mg/dL   Alkaline Phosphatase 44 39 - 117 IU/L   AST 15 0 - 40 IU/L   ALT 8 0 - 32 IU/L  H. pylori breath test  Result Value Ref Range   H pylori Breath Test Negative Negative  Lipase  Result Value Ref Range   Lipase 35 14 - 72 U/L  Vitamin B12  Result Value Ref Range   Vitamin B-12 442 232 - 1,245 pg/mL  VITAMIN D 25 Hydroxy (Vit-D Deficiency, Fractures)  Result Value Ref Range   Vit D, 25-Hydroxy 17.3 (L) 30.0 - 100.0 ng/mL  Hemoglobin A1c  Result Value Ref Range   Hgb A1c MFr Bld 4.9 4.8 - 5.6 %   Est. average glucose Bld gHb Est-mCnc 94 mg/dL  Celiac Disease Ab Screen w/Rfx  Result Value Ref Range   Antigliadin Abs, IgA WILL FOLLOW    Transglutaminase IgA <2 0 - 3 U/mL   IgA/Immunoglobulin A, Serum 222 87 - 352 mg/dL  POCT CBC  Result Value Ref Range   WBC 6.8 4.6 - 10.2 K/uL   Lymph, poc 1.8 0.6 - 3.4   POC LYMPH PERCENT 26.2 10 - 50 %L   MID (cbc) 0.2 0 - 0.9   POC MID % 2.9 0 - 12 %M   POC Granulocyte 4.8 2 - 6.9   Granulocyte percent 70.9 37 - 80 %G   RBC 4.22 4.04 - 5.48 M/uL     Hemoglobin 13.0 12.2 - 16.2 g/dL   HCT, POC 56.339.4 87.537.7 - 47.9 %   MCV 93.4 80 - 97 fL   MCH, POC 30.7 27 - 31.2 pg   MCHC 32.9 31.8 - 35.4 g/dL   RDW, POC 64.313.2 %   Platelet Count, POC 255 142 - 424 K/uL   MPV 7.6 0 - 99.8 fL  POCT urinalysis dipstick  Result Value Ref Range   Color, UA yellow yellow   Clarity, UA cloudy (A) clear   Glucose, UA negative negative mg/dL   Bilirubin, UA small (A) negative   Ketones, POC UA negative negative mg/dL   Spec Grav, UA >=3.295>=1.030 (A) 1.010 - 1.025   Blood, UA large (A) negative   pH, UA 6.0 5.0 - 8.0   Protein Ur, POC =30 (A) negative mg/dL   Urobilinogen, UA 1.0 0.2 or 1.0 E.U./dL   Nitrite, UA Positive (A) Negative  Leukocytes, UA Trace (A) Negative

## 2017-08-03 NOTE — Patient Instructions (Addendum)
I recommend doing a miralax clean-out. Put 14 (not kidding) doses of miralax (polyethylene glycol) into 64 oz of any clear, non-carbonated liquid (apple juice, gatorade, water) and drink this WITHIN 24 HOURS!!!! For the next 2d, plan to stay home and just hang around the toilet as you will hopefully be having 8 BM/day of LIQUID stool.  If the diarrhea occurs soon after starting process without passing a significant stool volume or if you are having any fecal incontinence you should keep going as this may be the initial miralax washing around the larger stools.  Then you need to keep cleaned out by drinking 1 dose of miralax a day.     You can call the 24-hour [facility] HelpLine at [phone] or [phone] for immediate assistance. Among several different types of services, they offer an Intensive oupatient program for mood disorders - which is a group type setting Monday-Friday 9-noon.  You can schedule an assessment by calling the above numbers during which the costs for the program and insurance benefits will be reviewed.    Some excellent private psychiatrists for individual counseling are:  [facility] [address] [place], [place] [address] [phone]  [facility] [address] [place], [place] [address] [phone]  [facility] [address], [place], [place] [address]  Phone: [phone]  [facility] [address], [place], [place] [address]  [phone]  [facility] [address] [place], [place] Phone: [phone]   [facility]  [address], [place], [place] [address]  [phone]   You can always go to [facility] if suicidal or there are walk-in crisis services available at: [facility] [address] [place], [place] [address] [phone]   Hour of operations: Monday-Friday, 8:30-5 p.m.  They take medicare/medicaid and the patient can contact [facility] referral department at [phone] or [email or web address] for more information.      IF you received an x-ray today, you will receive an invoice from [facility]. Please contact [facility] at [phone] with questions or concerns regarding your invoice.   IF you received labwork today, you will receive an invoice from [facility]. Please contact [facility] at [phone] with questions or concerns regarding your invoice.   Our billing staff will not be able to assist you with questions regarding bills from these companies.  You will be contacted with the lab results as soon as they are available. The fastest way to get your results is to activate your My Chart account. Instructions are located on the last page of this paperwork. If you have not heard from us regarding the results in 2 weeks, please contact this office.     About Constipation  Constipation Overview Constipation is the most common gastrointestinal complaint - about 4 million Americans experience constipation and make 2.5 million physician visits a year to get help for the problem.  Constipation can occur when the colon absorbs too much water, the colon's muscle contraction is slow or sluggish, and/or there is delayed transit time through the colon.  The result is stool that is hard and dry.  Indicators of constipation include straining during bowel movements greater than 25% of the time, having fewer than three bowel movements per week, and/or the feeling of incomplete evacuation.  There are established guidelines ([place] II ) for defining constipation. A person needs to have two or more  of the following symptoms for at least 12 weeks (not necessarily consecutive) in the preceding 12 months: . Straining in  greater than 25% of bowel movements . Lumpy or hard stools in greater than 25% of bowel  movements . Sensation of incomplete emptying in greater than 25% of bowel movements . Sensation of anorectal obstruction/blockade in greater than 25% of bowel movements . Manual maneuvers to help empty greater than 25% of bowel movements (e.g., digital evacuation, support of the pelvic floor)  . Less than  3 bowel movements/week . Loose stools are not present, and criteria for irritable bowel syndrome are insufficient  Common Causes of Constipation . Lack of fiber in your diet . Lack of physical activity . Medications, including iron and calcium supplements  . Dairy intake . Dehydration . Abuse of laxatives  Travel  Irritable Bowel Syndrome  Pregnancy  Luteal phase of menstruation (after ovulation and before menses)  Colorectal problems  Intestinal Dysfunction  Treating Constipation  There are several ways of treating constipation, including changes to diet and exercise, use of laxatives, adjustments to the pelvic floor, and scheduled toileting.  These treatments include: . increasing fiber and fluids in the diet  . increasing physical activity . learning muscle coordination   learning proper toileting techniques and toileting modifications   designing and sticking  to a toileting schedule     2007, [employer] Doc.22 Constipation, Adult Constipation is when a person has fewer bowel movements in a week than normal, has difficulty having a bowel movement, or has stools that are dry, hard, or larger than normal. Constipation may be caused by an underlying condition. It may become worse with age if a person takes certain medicines and does not take in enough fluids. Follow these instructions at home: Eating and drinking   Eat foods that have a lot of fiber, such as fresh fruits and vegetables, whole grains, and beans.  Limit foods that are high in fat, low in fiber, or overly processed, such as french fries, hamburgers, cookies, candies, and soda.  Drink  enough fluid to keep your urine clear or pale yellow. General instructions  Exercise regularly or as told by your health care provider.  Go to the restroom when you have the urge to go. Do not hold it in.  Take over-the-counter and prescription medicines only as told by your health care provider. These include any fiber supplements.  Practice pelvic floor retraining exercises, such as deep breathing while relaxing the lower abdomen and pelvic floor relaxation during bowel movements.  Watch your condition for any changes.  Keep all follow-up visits as told by your health care provider. This is important. Contact a health care provider if:  You have pain that gets worse.  You have a fever.  You do not have a bowel movement after 4 days.  You vomit.  You are not hungry.  You lose weight.  You are bleeding from the anus.  You have thin, pencil-like stools. Get help right away if:  You have a fever and your symptoms suddenly get worse.  You leak stool or have blood in your stool.  Your abdomen is bloated.  You have severe pain in your abdomen.  You feel dizzy or you faint. This information is not intended to replace advice given to you by your health care provider. Make sure you discuss any questions you have with your health care provider. Document Released: 02/05/2004 Document Revised: 11/27/2015 Document Reviewed: 10/28/2015 Elsevier Interactive Patient Education  2018 Elsevier Inc.  

## 2017-08-05 LAB — H. PYLORI BREATH TEST: H PYLORI BREATH TEST: NEGATIVE

## 2017-08-06 DIAGNOSIS — E538 Deficiency of other specified B group vitamins: Secondary | ICD-10-CM | POA: Insufficient documentation

## 2017-08-06 DIAGNOSIS — E559 Vitamin D deficiency, unspecified: Secondary | ICD-10-CM | POA: Insufficient documentation

## 2017-08-06 DIAGNOSIS — F17200 Nicotine dependence, unspecified, uncomplicated: Secondary | ICD-10-CM | POA: Insufficient documentation

## 2017-08-06 MED ORDER — VITAMIN D (ERGOCALCIFEROL) 1.25 MG (50000 UNIT) PO CAPS: 50000.0000 [IU] | ORAL_CAPSULE | ORAL | 0 refills | Status: AC

## 2017-08-06 MED ORDER — CEPHALEXIN 500 MG PO CAPS: 500.0000 mg | ORAL_CAPSULE | Freq: Three times a day (TID) | ORAL | 0 refills | Status: AC

## 2017-08-07 LAB — COMPREHENSIVE METABOLIC PANEL
A/G RATIO: 1.7 (ref 1.2–2.2)
ALK PHOS: 44 IU/L (ref 39–117)
ALT: 8 IU/L (ref 0–32)
AST: 15 IU/L (ref 0–40)
Albumin: 4.1 g/dL (ref 3.5–5.5)
BUN / CREAT RATIO: 13 (ref 9–23)
BUN: 8 mg/dL (ref 6–20)
CO2: 22 mmol/L (ref 20–29)
Calcium: 8.6 mg/dL — ABNORMAL LOW (ref 8.7–10.2)
Chloride: 107 mmol/L — ABNORMAL HIGH (ref 96–106)
Creatinine, Ser: 0.63 mg/dL (ref 0.57–1.00)
GFR calc Af Amer: 148 mL/min/{1.73_m2} (ref >59)
GFR calc non Af Amer: 129 mL/min/{1.73_m2} (ref >59)
GLOBULIN, TOTAL: 2.4 g/dL (ref 1.5–4.5)
Glucose: 85 mg/dL (ref 65–99)
POTASSIUM: 4.6 mmol/L (ref 3.5–5.2)
SODIUM: 140 mmol/L (ref 134–144)
Total Protein: 6.5 g/dL (ref 6.0–8.5)

## 2017-08-07 LAB — HEMOGLOBIN A1C
Est. average glucose Bld gHb Est-mCnc: 94 mg/dL
Hgb A1c MFr Bld: 4.9 % (ref 4.8–5.6)

## 2017-08-07 LAB — CELIAC DISEASE AB SCREEN W/RFX
Antigliadin Abs, IgA: 5 units (ref 0–19)
IGA/IMMUNOGLOBULIN A, SERUM: 222 mg/dL (ref 87–352)
Transglutaminase IgA: <2 U/mL (ref 0–3)

## 2017-08-07 LAB — VITAMIN D 25 HYDROXY (VIT D DEFICIENCY, FRACTURES): Vit D, 25-Hydroxy: 17.3 ng/mL — ABNORMAL LOW (ref 30.0–100.0)

## 2017-08-07 LAB — VITAMIN B12: VITAMIN B 12: 442 pg/mL (ref 232–1245)

## 2017-08-07 LAB — LIPASE: LIPASE: 35 U/L (ref 14–72)

## 2017-12-14 ENCOUNTER — Ambulatory Visit: Payer: Managed Care, Other (non HMO) | Admitting: Neurology

## 2018-04-13 IMAGING — MR MR HEAD WO/W CM
9 of 11 series · 35 of 48 positions shown · IV contrast (multihance)
Comparison: None.

CLINICAL DATA: Seizure like activity since 2920.

EXAM:
MRI HEAD WITHOUT AND WITH CONTRAST
TECHNIQUE: Multiplanar, multiecho pulse sequences of the brain and surrounding
structures were obtained without and with intravenous contrast.
CONTRAST:  14mL MULTIHANCE GADOBENATE DIMEGLUMINE 529 MG/ML IV SOLN

[Series 2: T1 · sagittal · 5.0mm · 0.45mm/px · 3 of 21 slices shown]
[im 1/21]
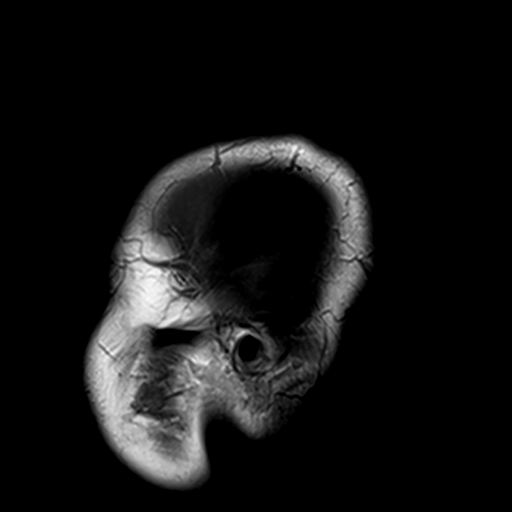
[im 11/21]
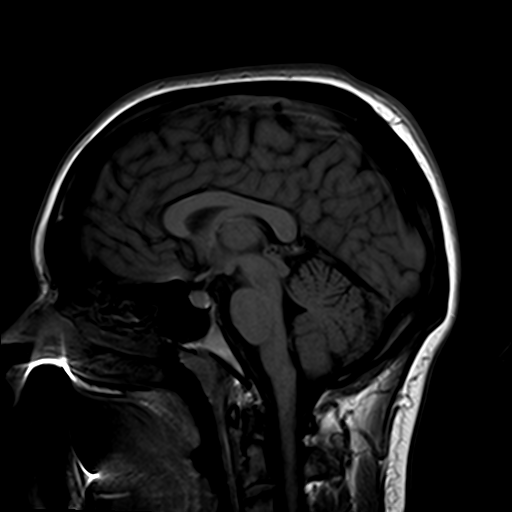
[im 21/21]
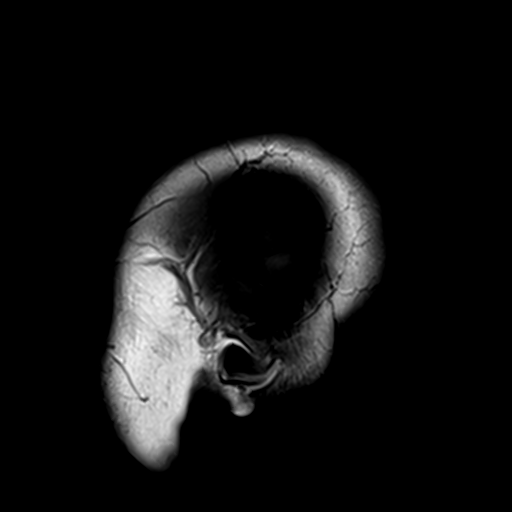

[Series 3: DWI · axial · 3.0mm · 1.80mm/px · z∈[-73,+73]mm · 9 of 100 slices shown (1 of 2)]
[im 1/100]
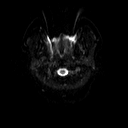
[im 13/100]
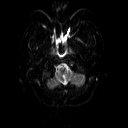
[im 25/100]
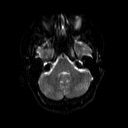
[im 38/100]
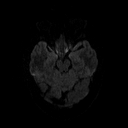
[im 50/100]
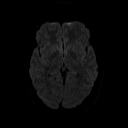
[im 62/100]
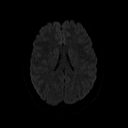
[im 75/100]
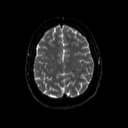
[im 87/100]
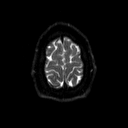
[im 100/100]
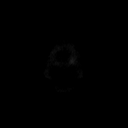

[Series 4: DWI · axial · 3.0mm · 1.80mm/px · z∈[-73,+73]mm · 5 of 50 slices shown (2 of 2)]
[im 1/50]
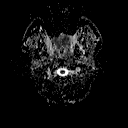
[im 13/50]
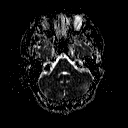
[im 25/50]
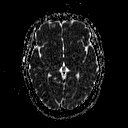
[im 37/50]
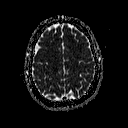
[im 50/50]
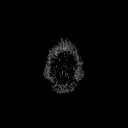

[Series 5: T2 · axial · 5.0mm · 0.51mm/px · z∈[-69,+72]mm · 2 of 22 slices shown (1 of 3)]
[im 1/22]
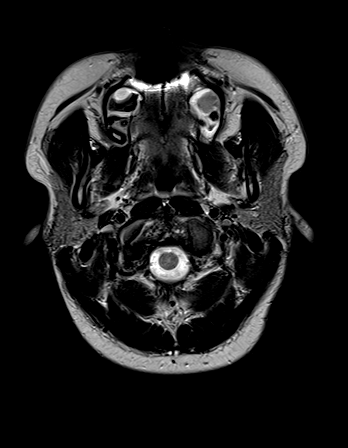
[im 22/22]
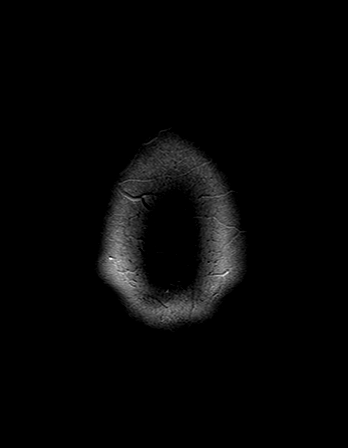

[Series 6: FLAIR · axial · 5.0mm · 0.45mm/px · z∈[-70,+71]mm · 2 of 22 slices shown]
[im 1/22]
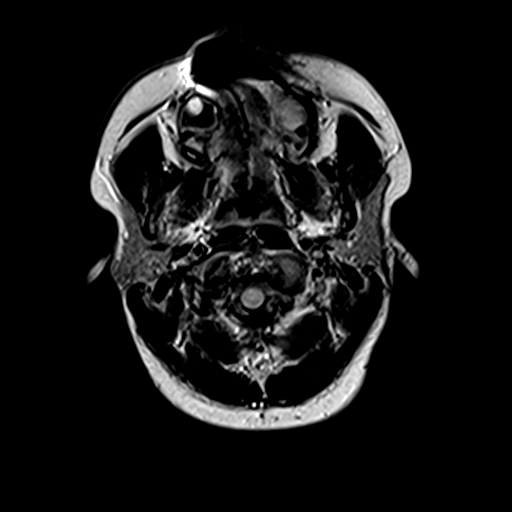
[im 22/22]
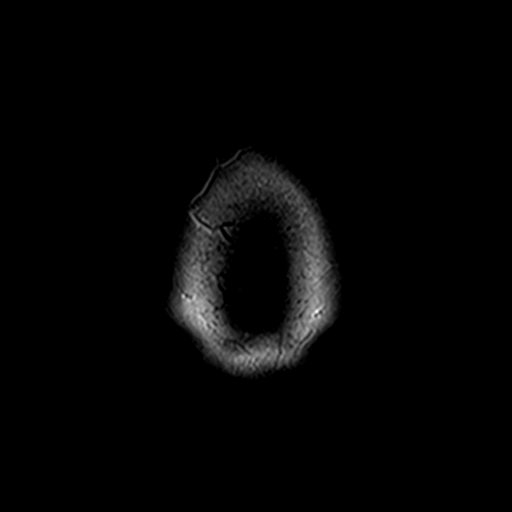

[Series 8: swi_images · axial · 2.0mm · 0.90mm/px · z∈[-78,+79]mm · 7 of 80 slices shown]
[im 1/80]
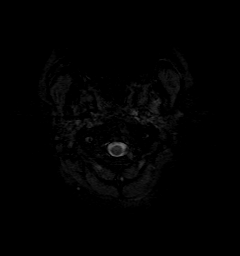
[im 14/80]
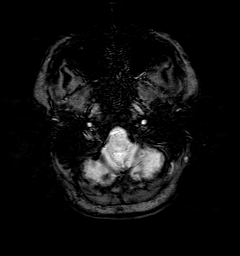
[im 27/80]
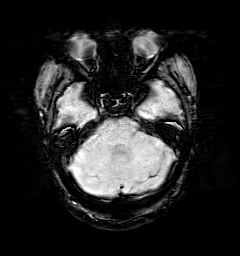
[im 40/80]
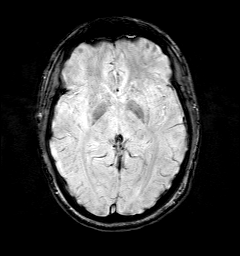
[im 53/80]
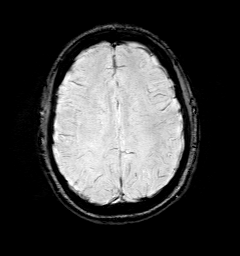
[im 66/80]
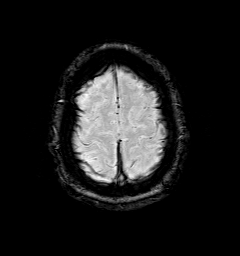
[im 80/80]
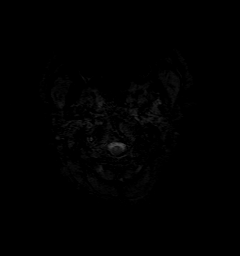

[Series 9: t1_mpr_tra · axial · 2.0mm · 0.45mm/px · z∈[-77,-25]mm · 3 of 80 slices shown]
[im 1/80]
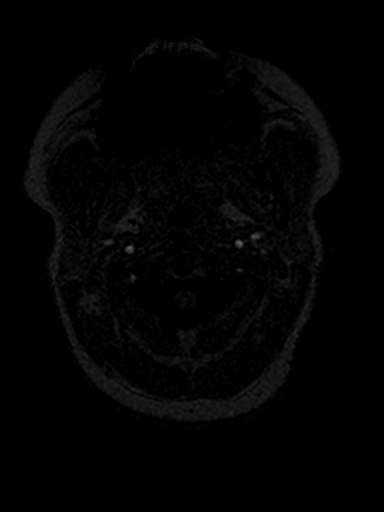
[im 14/80]
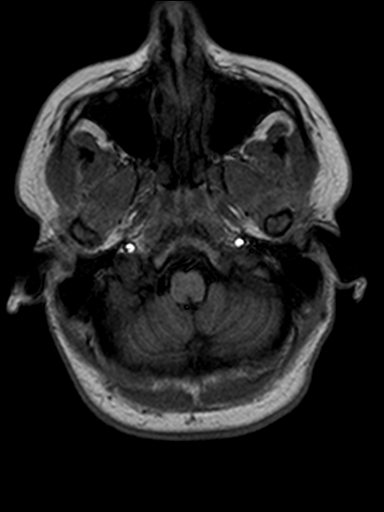
[im 27/80]
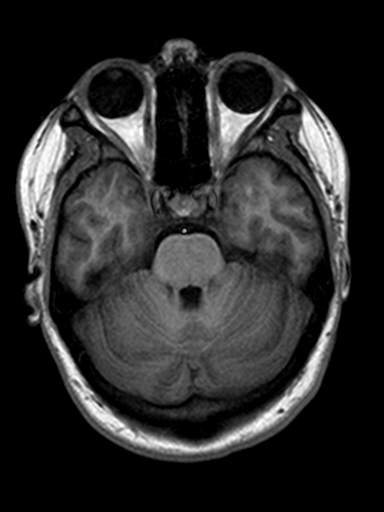

[Series 10: T2 · coronal · 3.0mm · 0.22mm/px · 2 of 20 slices shown (2 of 3)]
[im 1/20]
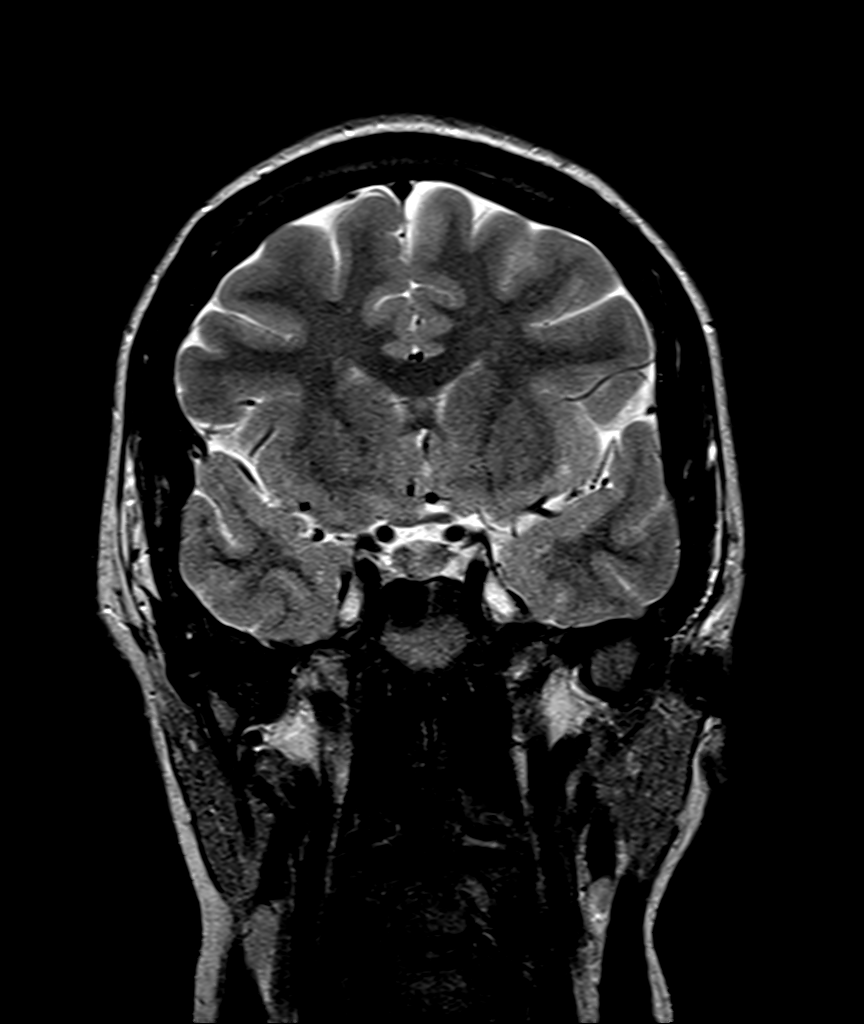
[im 20/20]
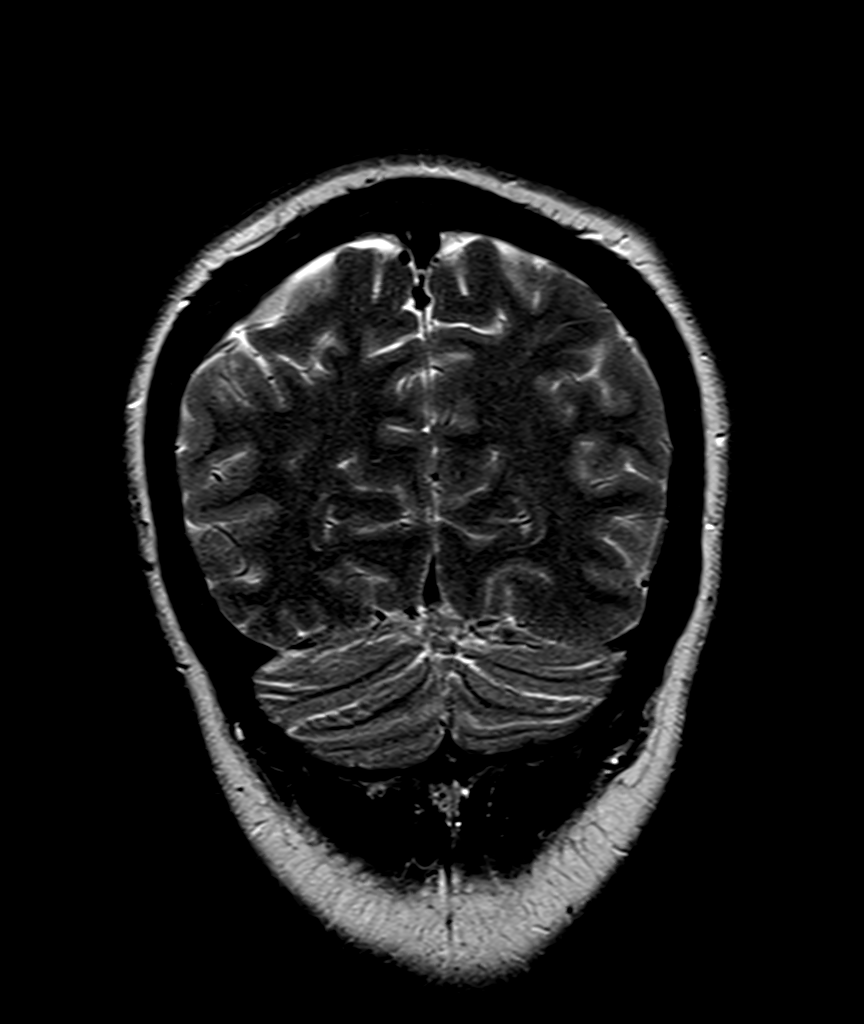

[Series 11: T2 · coronal · 5.0mm · 0.45mm/px · 2 of 27 slices shown (3 of 3)]
[im 1/27]
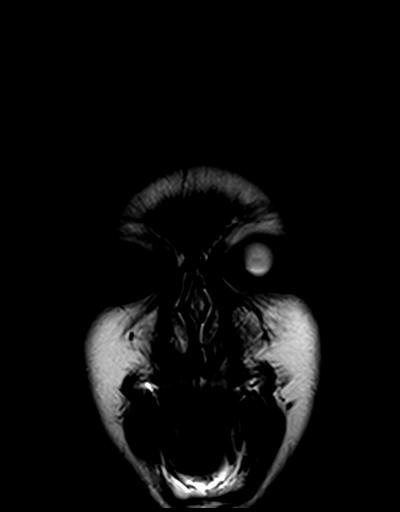
[im 27/27]
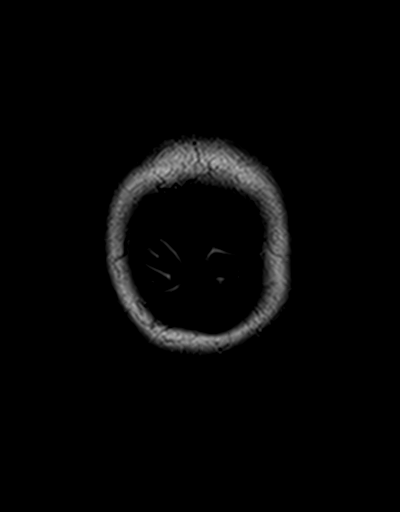

[35 of 48 positions shown; findings below may reference images not displayed]

FINDINGS: Brain: Brain has normal appearance without evidence of malformation,
atrophy, old or acute small or large vessel infarction, mass lesion,
hemorrhage, hydrocephalus or extra-axial collection. No mesial
temporal abnormality is seen. No abnormal contrast enhancement.

Vascular: Major vessels at the base of the brain show flow. Venous
sinuses appear patent.

Skull and upper cervical spine: Normal.

Sinuses/Orbits: Mild seasonal mucosal thickening. No advanced
sinusitis. Orbits negative.

Other: None significant.
IMPRESSION: Normal examination.  No abnormality seen to explain seizures.

## 2018-05-02 IMAGING — DX DG ABDOMEN 1V
2 series · 2 of 2 positions shown · non-contrast
Comparison: None.

CLINICAL DATA: Nausea after eating, some bright red blood per
rectum

EXAM:
ABDOMEN - 1 VIEW

[abdomen kub (1 of 2)]
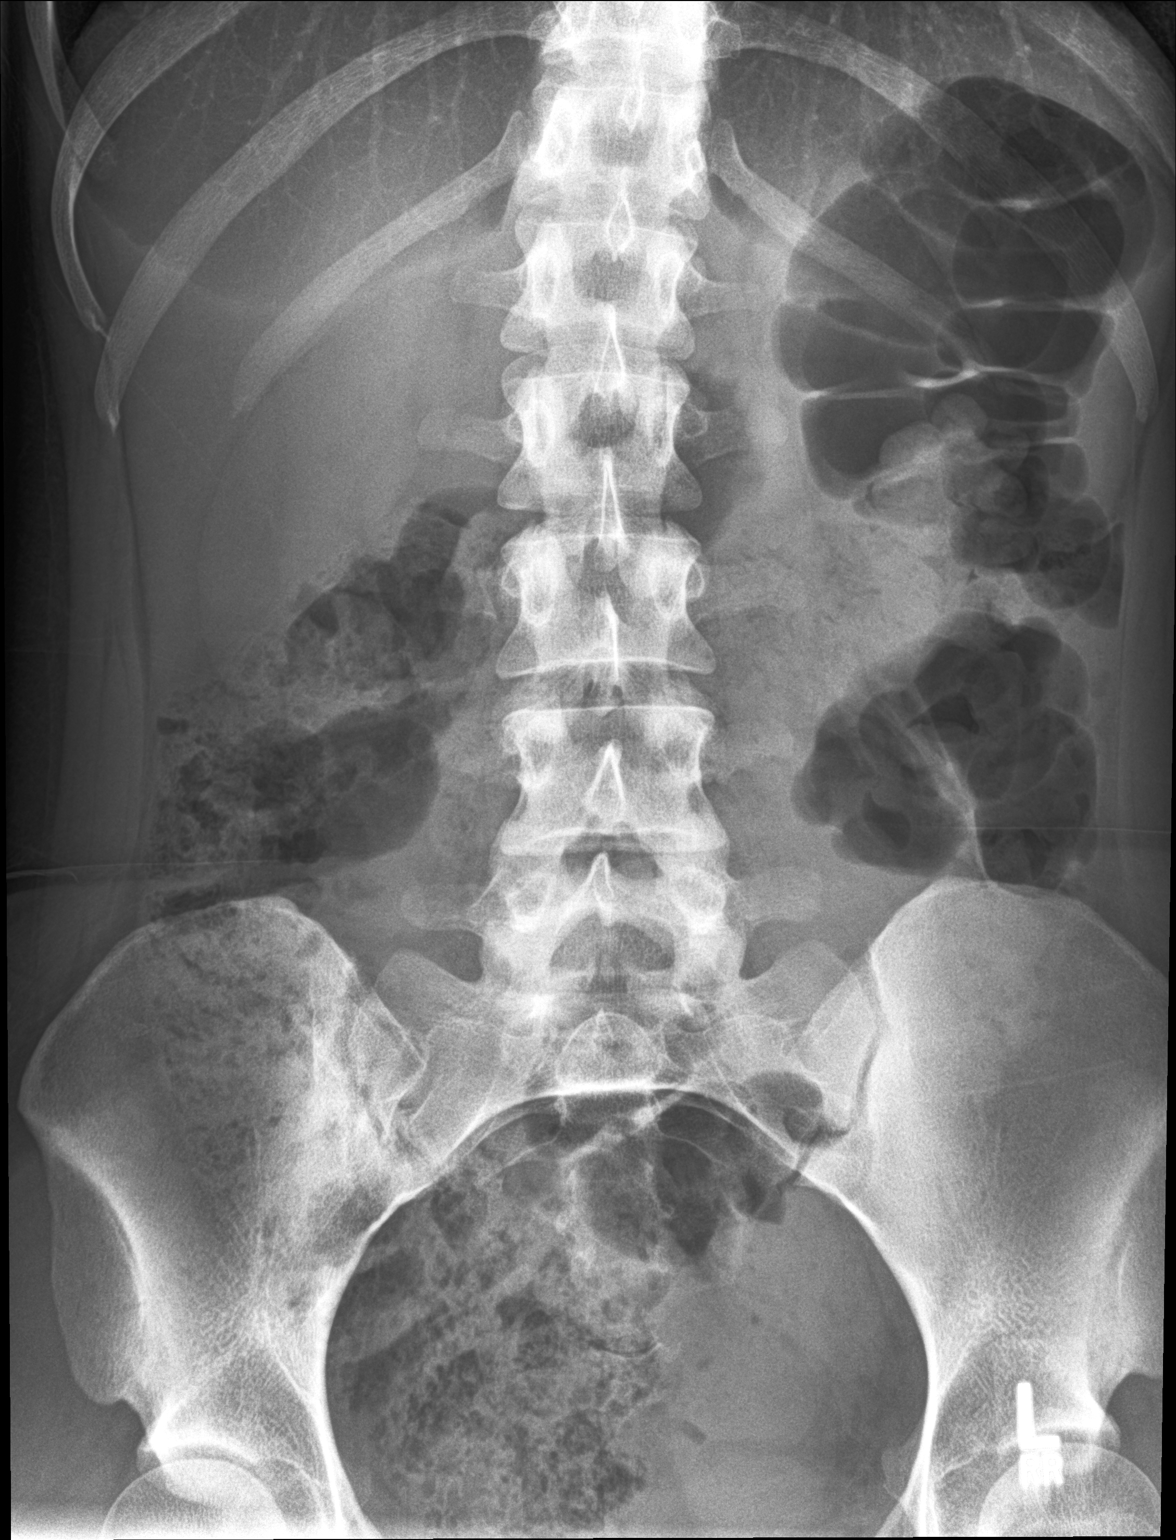

[abdomen kub (2 of 2)]
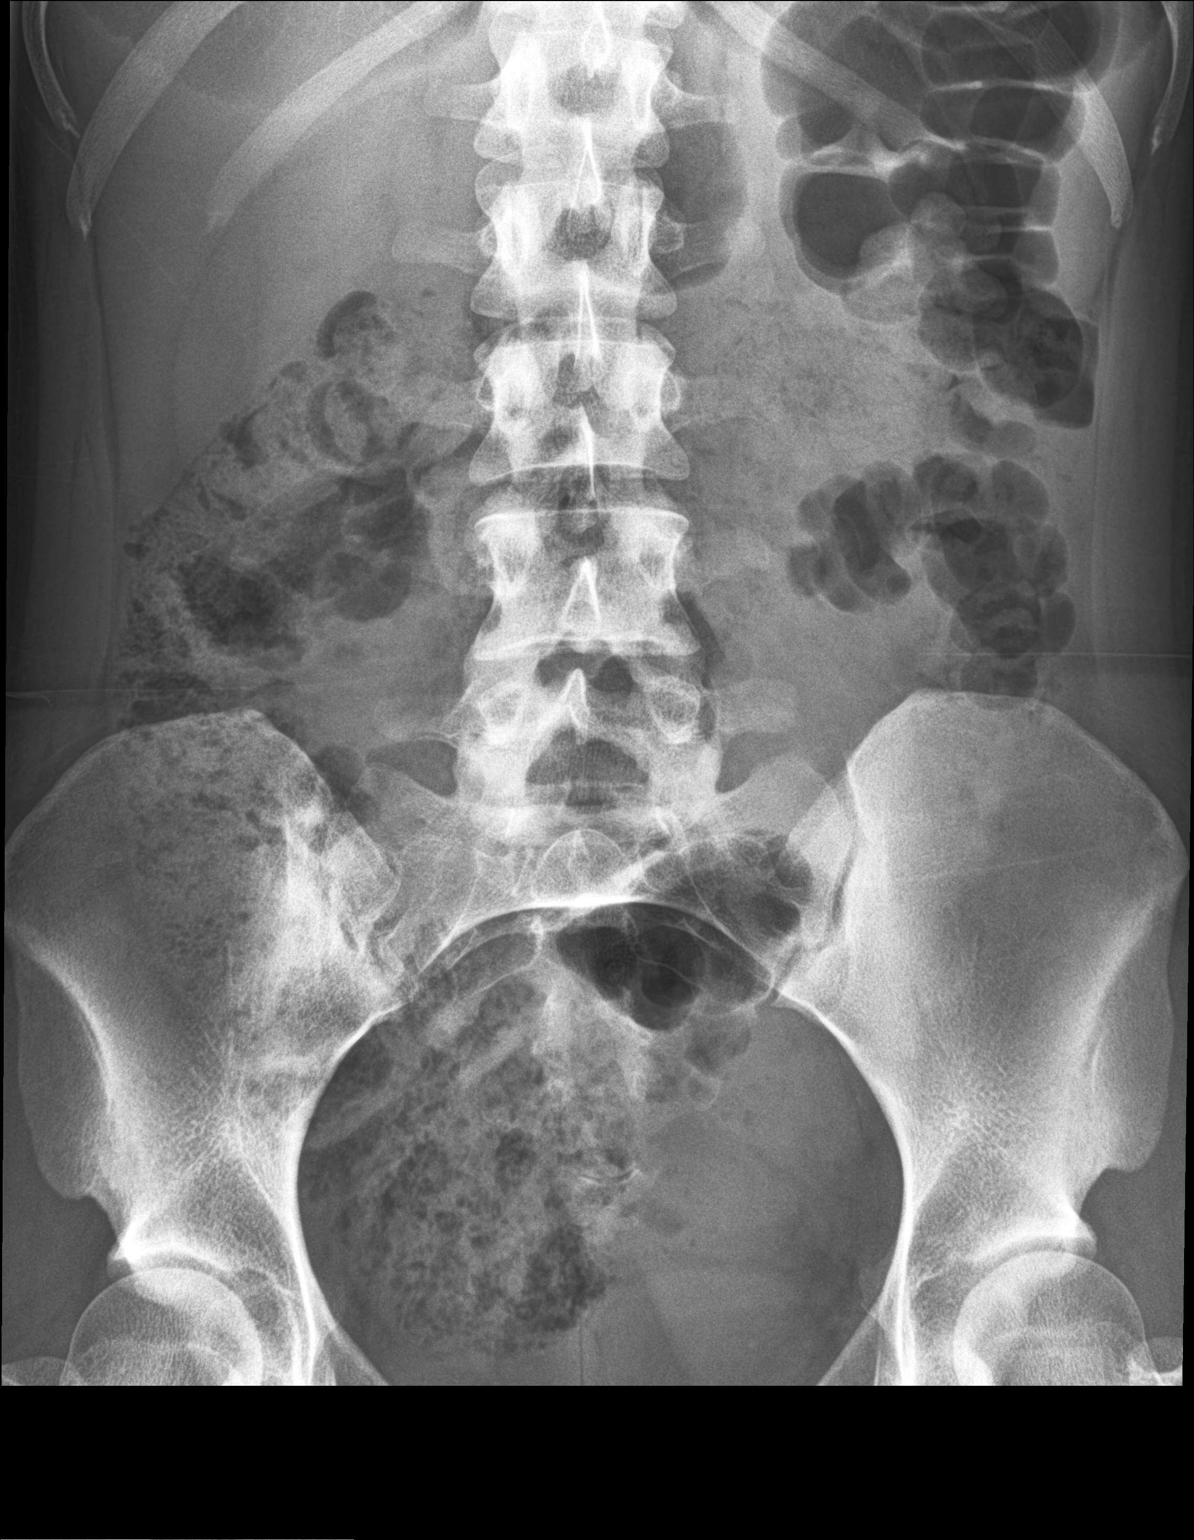

[2 of 2 positions shown; findings below may reference images not displayed]

FINDINGS: Supine views of the abdomen do show a moderately large amount of
feces throughout the colon particularly the right colon and
transverse colon. No bowel obstruction is seen. No opaque calculi
are noted. The bones are unremarkable.
IMPRESSION: Moderately large amount of feces primarily in the right colon and
transverse colon. No bowel obstruction.
